# Patient Record
Sex: Female | Born: 1960 | Race: Black or African American | Hispanic: No | Marital: Single | Smoking: Never smoker
Health system: Southern US, Community
[De-identification: ages and names within clinical notes are randomized; demographics above are authoritative.]

## PROBLEM LIST (undated history)

## (undated) ENCOUNTER — Emergency Department (HOSPITAL_BASED_OUTPATIENT_CLINIC_OR_DEPARTMENT_OTHER): Admission: EM | Payer: BC Managed Care – PPO | Source: Home / Self Care

## (undated) DIAGNOSIS — I2699 Other pulmonary embolism without acute cor pulmonale: Secondary | ICD-10-CM

## (undated) DIAGNOSIS — F419 Anxiety disorder, unspecified: Secondary | ICD-10-CM

## (undated) DIAGNOSIS — J45909 Unspecified asthma, uncomplicated: Secondary | ICD-10-CM

## (undated) DIAGNOSIS — M940 Chondrocostal junction syndrome [Tietze]: Secondary | ICD-10-CM

## (undated) DIAGNOSIS — M1990 Unspecified osteoarthritis, unspecified site: Secondary | ICD-10-CM

## (undated) DIAGNOSIS — K219 Gastro-esophageal reflux disease without esophagitis: Secondary | ICD-10-CM

## (undated) DIAGNOSIS — I82409 Acute embolism and thrombosis of unspecified deep veins of unspecified lower extremity: Secondary | ICD-10-CM

## (undated) DIAGNOSIS — I10 Essential (primary) hypertension: Secondary | ICD-10-CM

## (undated) HISTORY — PX: LUMBAR DISC ARTHROPLASTY: SHX699

## (undated) HISTORY — DX: Anxiety disorder, unspecified: F41.9

## (undated) HISTORY — PX: KNEE ARTHROSCOPY W/ MENISCAL REPAIR: SHX1877

## (undated) HISTORY — PX: SPINE SURGERY: SHX786

## (undated) HISTORY — PX: CHOLECYSTECTOMY: SHX55

## (undated) HISTORY — DX: Gastro-esophageal reflux disease without esophagitis: K21.9

## (undated) HISTORY — DX: Chondrocostal junction syndrome (tietze): M94.0

---

## 2008-03-27 DIAGNOSIS — J410 Simple chronic bronchitis: Secondary | ICD-10-CM | POA: Insufficient documentation

## 2008-03-27 DIAGNOSIS — J42 Unspecified chronic bronchitis: Secondary | ICD-10-CM | POA: Insufficient documentation

## 2008-03-27 HISTORY — DX: Simple chronic bronchitis: J41.0

## 2008-09-03 DIAGNOSIS — K219 Gastro-esophageal reflux disease without esophagitis: Secondary | ICD-10-CM | POA: Insufficient documentation

## 2008-09-03 DIAGNOSIS — D259 Leiomyoma of uterus, unspecified: Secondary | ICD-10-CM | POA: Insufficient documentation

## 2008-09-03 DIAGNOSIS — J45909 Unspecified asthma, uncomplicated: Secondary | ICD-10-CM | POA: Insufficient documentation

## 2008-09-03 HISTORY — DX: Leiomyoma of uterus, unspecified: D25.9

## 2008-09-03 HISTORY — DX: Gastro-esophageal reflux disease without esophagitis: K21.9

## 2008-11-27 DIAGNOSIS — E559 Vitamin D deficiency, unspecified: Secondary | ICD-10-CM

## 2008-11-27 HISTORY — DX: Vitamin D deficiency, unspecified: E55.9

## 2010-05-17 DIAGNOSIS — I10 Essential (primary) hypertension: Secondary | ICD-10-CM | POA: Insufficient documentation

## 2010-05-17 HISTORY — DX: Essential (primary) hypertension: I10

## 2013-04-05 DIAGNOSIS — R9439 Abnormal result of other cardiovascular function study: Secondary | ICD-10-CM | POA: Insufficient documentation

## 2013-04-05 DIAGNOSIS — R079 Chest pain, unspecified: Secondary | ICD-10-CM | POA: Insufficient documentation

## 2013-04-05 HISTORY — DX: Abnormal result of other cardiovascular function study: R94.39

## 2014-06-26 HISTORY — DX: Morbid (severe) obesity due to excess calories: E66.01

## 2014-08-28 DIAGNOSIS — M170 Bilateral primary osteoarthritis of knee: Secondary | ICD-10-CM | POA: Insufficient documentation

## 2014-08-28 HISTORY — DX: Bilateral primary osteoarthritis of knee: M17.0

## 2015-10-19 DIAGNOSIS — R739 Hyperglycemia, unspecified: Secondary | ICD-10-CM | POA: Insufficient documentation

## 2015-10-19 HISTORY — DX: Hyperglycemia, unspecified: R73.9

## 2017-08-09 DIAGNOSIS — E782 Mixed hyperlipidemia: Secondary | ICD-10-CM | POA: Insufficient documentation

## 2017-08-09 DIAGNOSIS — D509 Iron deficiency anemia, unspecified: Secondary | ICD-10-CM | POA: Insufficient documentation

## 2017-08-09 DIAGNOSIS — D573 Sickle-cell trait: Secondary | ICD-10-CM | POA: Insufficient documentation

## 2017-08-09 HISTORY — DX: Iron deficiency anemia, unspecified: D50.9

## 2017-08-09 HISTORY — DX: Sickle-cell trait: D57.3

## 2017-08-09 HISTORY — DX: Morbid (severe) obesity due to excess calories: E66.01

## 2017-08-09 HISTORY — DX: Mixed hyperlipidemia: E78.2

## 2017-08-09 LAB — HM PAP SMEAR: HM Pap smear: NORMAL

## 2017-08-10 DIAGNOSIS — M1711 Unilateral primary osteoarthritis, right knee: Secondary | ICD-10-CM | POA: Insufficient documentation

## 2017-08-10 HISTORY — DX: Unilateral primary osteoarthritis, right knee: M17.11

## 2018-05-08 DIAGNOSIS — R053 Chronic cough: Secondary | ICD-10-CM | POA: Insufficient documentation

## 2018-05-08 DIAGNOSIS — F419 Anxiety disorder, unspecified: Secondary | ICD-10-CM | POA: Insufficient documentation

## 2018-06-05 DIAGNOSIS — H938X2 Other specified disorders of left ear: Secondary | ICD-10-CM | POA: Insufficient documentation

## 2019-05-07 DIAGNOSIS — F411 Generalized anxiety disorder: Secondary | ICD-10-CM | POA: Diagnosis not present

## 2019-05-07 DIAGNOSIS — F329 Major depressive disorder, single episode, unspecified: Secondary | ICD-10-CM | POA: Diagnosis not present

## 2019-06-14 DIAGNOSIS — M1711 Unilateral primary osteoarthritis, right knee: Secondary | ICD-10-CM | POA: Diagnosis not present

## 2019-06-14 DIAGNOSIS — M7662 Achilles tendinitis, left leg: Secondary | ICD-10-CM | POA: Diagnosis not present

## 2019-08-14 DIAGNOSIS — F411 Generalized anxiety disorder: Secondary | ICD-10-CM | POA: Diagnosis not present

## 2019-08-14 DIAGNOSIS — F329 Major depressive disorder, single episode, unspecified: Secondary | ICD-10-CM | POA: Diagnosis not present

## 2019-12-04 DIAGNOSIS — M25561 Pain in right knee: Secondary | ICD-10-CM | POA: Diagnosis not present

## 2019-12-04 DIAGNOSIS — M1711 Unilateral primary osteoarthritis, right knee: Secondary | ICD-10-CM | POA: Diagnosis not present

## 2019-12-05 DIAGNOSIS — M1711 Unilateral primary osteoarthritis, right knee: Secondary | ICD-10-CM | POA: Diagnosis not present

## 2019-12-05 DIAGNOSIS — R531 Weakness: Secondary | ICD-10-CM | POA: Diagnosis not present

## 2019-12-05 DIAGNOSIS — M25661 Stiffness of right knee, not elsewhere classified: Secondary | ICD-10-CM | POA: Diagnosis not present

## 2019-12-12 DIAGNOSIS — R531 Weakness: Secondary | ICD-10-CM | POA: Diagnosis not present

## 2019-12-12 DIAGNOSIS — M1711 Unilateral primary osteoarthritis, right knee: Secondary | ICD-10-CM | POA: Diagnosis not present

## 2019-12-12 DIAGNOSIS — M25661 Stiffness of right knee, not elsewhere classified: Secondary | ICD-10-CM | POA: Diagnosis not present

## 2019-12-20 DIAGNOSIS — R531 Weakness: Secondary | ICD-10-CM | POA: Diagnosis not present

## 2019-12-20 DIAGNOSIS — M1711 Unilateral primary osteoarthritis, right knee: Secondary | ICD-10-CM | POA: Diagnosis not present

## 2019-12-20 DIAGNOSIS — M25661 Stiffness of right knee, not elsewhere classified: Secondary | ICD-10-CM | POA: Diagnosis not present

## 2019-12-23 DIAGNOSIS — M1711 Unilateral primary osteoarthritis, right knee: Secondary | ICD-10-CM | POA: Diagnosis not present

## 2019-12-23 DIAGNOSIS — M25661 Stiffness of right knee, not elsewhere classified: Secondary | ICD-10-CM | POA: Diagnosis not present

## 2019-12-23 DIAGNOSIS — R531 Weakness: Secondary | ICD-10-CM | POA: Diagnosis not present

## 2019-12-30 DIAGNOSIS — M25661 Stiffness of right knee, not elsewhere classified: Secondary | ICD-10-CM | POA: Diagnosis not present

## 2019-12-30 DIAGNOSIS — M1711 Unilateral primary osteoarthritis, right knee: Secondary | ICD-10-CM | POA: Diagnosis not present

## 2019-12-30 DIAGNOSIS — R531 Weakness: Secondary | ICD-10-CM | POA: Diagnosis not present

## 2020-01-02 DIAGNOSIS — M1711 Unilateral primary osteoarthritis, right knee: Secondary | ICD-10-CM | POA: Diagnosis not present

## 2020-01-11 DIAGNOSIS — K859 Acute pancreatitis without necrosis or infection, unspecified: Secondary | ICD-10-CM | POA: Diagnosis not present

## 2020-01-11 DIAGNOSIS — F411 Generalized anxiety disorder: Secondary | ICD-10-CM | POA: Diagnosis not present

## 2020-01-12 DIAGNOSIS — Z91018 Allergy to other foods: Secondary | ICD-10-CM | POA: Diagnosis not present

## 2020-01-12 DIAGNOSIS — J45909 Unspecified asthma, uncomplicated: Secondary | ICD-10-CM | POA: Diagnosis not present

## 2020-01-12 DIAGNOSIS — K859 Acute pancreatitis without necrosis or infection, unspecified: Secondary | ICD-10-CM | POA: Diagnosis not present

## 2020-01-12 DIAGNOSIS — Z79899 Other long term (current) drug therapy: Secondary | ICD-10-CM | POA: Diagnosis not present

## 2020-01-12 DIAGNOSIS — Z91010 Allergy to peanuts: Secondary | ICD-10-CM | POA: Diagnosis not present

## 2020-01-12 DIAGNOSIS — Z981 Arthrodesis status: Secondary | ICD-10-CM | POA: Diagnosis not present

## 2020-01-12 DIAGNOSIS — F411 Generalized anxiety disorder: Secondary | ICD-10-CM | POA: Diagnosis not present

## 2020-01-12 DIAGNOSIS — D649 Anemia, unspecified: Secondary | ICD-10-CM | POA: Diagnosis not present

## 2020-01-12 DIAGNOSIS — D509 Iron deficiency anemia, unspecified: Secondary | ICD-10-CM | POA: Diagnosis not present

## 2020-01-12 DIAGNOSIS — K439 Ventral hernia without obstruction or gangrene: Secondary | ICD-10-CM | POA: Diagnosis not present

## 2020-01-12 DIAGNOSIS — Z91040 Latex allergy status: Secondary | ICD-10-CM | POA: Diagnosis not present

## 2020-01-12 DIAGNOSIS — R1084 Generalized abdominal pain: Secondary | ICD-10-CM | POA: Diagnosis not present

## 2020-01-12 DIAGNOSIS — R109 Unspecified abdominal pain: Secondary | ICD-10-CM | POA: Diagnosis not present

## 2020-01-12 DIAGNOSIS — Z9049 Acquired absence of other specified parts of digestive tract: Secondary | ICD-10-CM | POA: Diagnosis not present

## 2020-01-12 DIAGNOSIS — F410 Panic disorder [episodic paroxysmal anxiety] without agoraphobia: Secondary | ICD-10-CM | POA: Diagnosis not present

## 2020-01-12 DIAGNOSIS — R1013 Epigastric pain: Secondary | ICD-10-CM | POA: Diagnosis not present

## 2020-01-12 DIAGNOSIS — Z6841 Body Mass Index (BMI) 40.0 and over, adult: Secondary | ICD-10-CM | POA: Diagnosis not present

## 2020-01-12 DIAGNOSIS — K279 Peptic ulcer, site unspecified, unspecified as acute or chronic, without hemorrhage or perforation: Secondary | ICD-10-CM | POA: Diagnosis not present

## 2020-01-12 DIAGNOSIS — K3189 Other diseases of stomach and duodenum: Secondary | ICD-10-CM | POA: Diagnosis not present

## 2020-01-12 DIAGNOSIS — N393 Stress incontinence (female) (male): Secondary | ICD-10-CM | POA: Diagnosis not present

## 2020-02-20 DIAGNOSIS — K8500 Idiopathic acute pancreatitis without necrosis or infection: Secondary | ICD-10-CM | POA: Diagnosis not present

## 2020-02-20 DIAGNOSIS — K2980 Duodenitis without bleeding: Secondary | ICD-10-CM | POA: Diagnosis not present

## 2020-02-20 DIAGNOSIS — K2970 Gastritis, unspecified, without bleeding: Secondary | ICD-10-CM | POA: Diagnosis not present

## 2020-03-11 DIAGNOSIS — Z1211 Encounter for screening for malignant neoplasm of colon: Secondary | ICD-10-CM | POA: Diagnosis not present

## 2020-03-11 DIAGNOSIS — I10 Essential (primary) hypertension: Secondary | ICD-10-CM | POA: Diagnosis not present

## 2020-03-11 DIAGNOSIS — M1710 Unilateral primary osteoarthritis, unspecified knee: Secondary | ICD-10-CM | POA: Diagnosis not present

## 2020-03-12 DIAGNOSIS — F411 Generalized anxiety disorder: Secondary | ICD-10-CM | POA: Diagnosis not present

## 2020-03-12 DIAGNOSIS — F329 Major depressive disorder, single episode, unspecified: Secondary | ICD-10-CM | POA: Diagnosis not present

## 2020-04-09 DIAGNOSIS — F411 Generalized anxiety disorder: Secondary | ICD-10-CM | POA: Diagnosis not present

## 2020-04-09 DIAGNOSIS — F329 Major depressive disorder, single episode, unspecified: Secondary | ICD-10-CM | POA: Diagnosis not present

## 2020-04-09 LAB — COLOGUARD
COLOGUARD: NEGATIVE
Cologuard: NEGATIVE

## 2020-04-22 DIAGNOSIS — Z6841 Body Mass Index (BMI) 40.0 and over, adult: Secondary | ICD-10-CM | POA: Diagnosis not present

## 2020-04-22 DIAGNOSIS — M170 Bilateral primary osteoarthritis of knee: Secondary | ICD-10-CM | POA: Diagnosis not present

## 2020-05-20 DIAGNOSIS — F329 Major depressive disorder, single episode, unspecified: Secondary | ICD-10-CM | POA: Diagnosis not present

## 2020-05-20 DIAGNOSIS — F411 Generalized anxiety disorder: Secondary | ICD-10-CM | POA: Diagnosis not present

## 2020-05-27 DIAGNOSIS — D649 Anemia, unspecified: Secondary | ICD-10-CM | POA: Diagnosis not present

## 2020-05-27 DIAGNOSIS — J018 Other acute sinusitis: Secondary | ICD-10-CM | POA: Diagnosis not present

## 2020-05-27 DIAGNOSIS — Z6841 Body Mass Index (BMI) 40.0 and over, adult: Secondary | ICD-10-CM | POA: Diagnosis not present

## 2020-05-27 DIAGNOSIS — I10 Essential (primary) hypertension: Secondary | ICD-10-CM | POA: Diagnosis not present

## 2020-07-24 DIAGNOSIS — M170 Bilateral primary osteoarthritis of knee: Secondary | ICD-10-CM | POA: Diagnosis not present

## 2020-08-06 DIAGNOSIS — J4520 Mild intermittent asthma, uncomplicated: Secondary | ICD-10-CM

## 2020-08-06 DIAGNOSIS — J4521 Mild intermittent asthma with (acute) exacerbation: Secondary | ICD-10-CM | POA: Insufficient documentation

## 2020-08-06 HISTORY — DX: Mild intermittent asthma, uncomplicated: J45.20

## 2020-08-07 DIAGNOSIS — F329 Major depressive disorder, single episode, unspecified: Secondary | ICD-10-CM | POA: Diagnosis not present

## 2020-08-07 DIAGNOSIS — F411 Generalized anxiety disorder: Secondary | ICD-10-CM | POA: Diagnosis not present

## 2020-09-02 DIAGNOSIS — F329 Major depressive disorder, single episode, unspecified: Secondary | ICD-10-CM | POA: Diagnosis not present

## 2020-09-02 DIAGNOSIS — F411 Generalized anxiety disorder: Secondary | ICD-10-CM | POA: Diagnosis not present

## 2020-11-02 ENCOUNTER — Inpatient Hospital Stay (HOSPITAL_BASED_OUTPATIENT_CLINIC_OR_DEPARTMENT_OTHER)
Admission: EM | Admit: 2020-11-02 | Discharge: 2020-11-05 | DRG: 176 | Disposition: A | Discharge status: Home / Self Care | Payer: BC Managed Care – PPO | Attending: Family Medicine | Admitting: Family Medicine

## 2020-11-02 ENCOUNTER — Other Ambulatory Visit: Payer: Self-pay

## 2020-11-02 ENCOUNTER — Encounter (HOSPITAL_BASED_OUTPATIENT_CLINIC_OR_DEPARTMENT_OTHER): Payer: Self-pay | Admitting: Emergency Medicine

## 2020-11-02 DIAGNOSIS — R911 Solitary pulmonary nodule: Secondary | ICD-10-CM | POA: Diagnosis not present

## 2020-11-02 DIAGNOSIS — F331 Major depressive disorder, recurrent, moderate: Secondary | ICD-10-CM | POA: Diagnosis not present

## 2020-11-02 DIAGNOSIS — Z882 Allergy status to sulfonamides status: Secondary | ICD-10-CM | POA: Diagnosis not present

## 2020-11-02 DIAGNOSIS — Z6841 Body Mass Index (BMI) 40.0 and over, adult: Secondary | ICD-10-CM

## 2020-11-02 DIAGNOSIS — M1990 Unspecified osteoarthritis, unspecified site: Secondary | ICD-10-CM | POA: Diagnosis present

## 2020-11-02 DIAGNOSIS — I2699 Other pulmonary embolism without acute cor pulmonale: Secondary | ICD-10-CM | POA: Diagnosis not present

## 2020-11-02 DIAGNOSIS — I10 Essential (primary) hypertension: Secondary | ICD-10-CM | POA: Diagnosis not present

## 2020-11-02 DIAGNOSIS — R7989 Other specified abnormal findings of blood chemistry: Secondary | ICD-10-CM | POA: Diagnosis not present

## 2020-11-02 DIAGNOSIS — I2694 Multiple subsegmental pulmonary emboli without acute cor pulmonale: Secondary | ICD-10-CM | POA: Diagnosis not present

## 2020-11-02 DIAGNOSIS — R918 Other nonspecific abnormal finding of lung field: Secondary | ICD-10-CM | POA: Diagnosis present

## 2020-11-02 DIAGNOSIS — D649 Anemia, unspecified: Secondary | ICD-10-CM | POA: Diagnosis present

## 2020-11-02 DIAGNOSIS — Z20822 Contact with and (suspected) exposure to covid-19: Secondary | ICD-10-CM | POA: Diagnosis not present

## 2020-11-02 DIAGNOSIS — I82431 Acute embolism and thrombosis of right popliteal vein: Secondary | ICD-10-CM | POA: Diagnosis present

## 2020-11-02 DIAGNOSIS — F411 Generalized anxiety disorder: Secondary | ICD-10-CM | POA: Diagnosis not present

## 2020-11-02 DIAGNOSIS — Z79899 Other long term (current) drug therapy: Secondary | ICD-10-CM | POA: Diagnosis not present

## 2020-11-02 DIAGNOSIS — I2602 Saddle embolus of pulmonary artery with acute cor pulmonale: Secondary | ICD-10-CM | POA: Diagnosis not present

## 2020-11-02 DIAGNOSIS — R0602 Shortness of breath: Secondary | ICD-10-CM | POA: Diagnosis not present

## 2020-11-02 DIAGNOSIS — J45909 Unspecified asthma, uncomplicated: Secondary | ICD-10-CM | POA: Diagnosis present

## 2020-11-02 DIAGNOSIS — Z7901 Long term (current) use of anticoagulants: Secondary | ICD-10-CM | POA: Diagnosis present

## 2020-11-02 HISTORY — DX: Unspecified osteoarthritis, unspecified site: M19.90

## 2020-11-02 HISTORY — DX: Unspecified asthma, uncomplicated: J45.909

## 2020-11-02 HISTORY — DX: Essential (primary) hypertension: I10

## 2020-11-02 NOTE — ED Triage Notes (Signed)
Pt c/o SOB when ambulating x 1 day

## 2020-11-03 ENCOUNTER — Emergency Department (HOSPITAL_BASED_OUTPATIENT_CLINIC_OR_DEPARTMENT_OTHER): Payer: BC Managed Care – PPO

## 2020-11-03 DIAGNOSIS — D649 Anemia, unspecified: Secondary | ICD-10-CM | POA: Diagnosis present

## 2020-11-03 DIAGNOSIS — R918 Other nonspecific abnormal finding of lung field: Secondary | ICD-10-CM | POA: Diagnosis present

## 2020-11-03 DIAGNOSIS — I82431 Acute embolism and thrombosis of right popliteal vein: Secondary | ICD-10-CM | POA: Diagnosis present

## 2020-11-03 DIAGNOSIS — I2602 Saddle embolus of pulmonary artery with acute cor pulmonale: Secondary | ICD-10-CM | POA: Diagnosis not present

## 2020-11-03 DIAGNOSIS — I10 Essential (primary) hypertension: Secondary | ICD-10-CM | POA: Diagnosis present

## 2020-11-03 DIAGNOSIS — I2699 Other pulmonary embolism without acute cor pulmonale: Secondary | ICD-10-CM

## 2020-11-03 DIAGNOSIS — Z79899 Other long term (current) drug therapy: Secondary | ICD-10-CM | POA: Diagnosis not present

## 2020-11-03 DIAGNOSIS — R0602 Shortness of breath: Secondary | ICD-10-CM | POA: Diagnosis present

## 2020-11-03 DIAGNOSIS — J45909 Unspecified asthma, uncomplicated: Secondary | ICD-10-CM | POA: Diagnosis present

## 2020-11-03 DIAGNOSIS — Z7901 Long term (current) use of anticoagulants: Secondary | ICD-10-CM | POA: Diagnosis present

## 2020-11-03 DIAGNOSIS — Z882 Allergy status to sulfonamides status: Secondary | ICD-10-CM | POA: Diagnosis not present

## 2020-11-03 DIAGNOSIS — Z20822 Contact with and (suspected) exposure to covid-19: Secondary | ICD-10-CM | POA: Diagnosis present

## 2020-11-03 DIAGNOSIS — M1990 Unspecified osteoarthritis, unspecified site: Secondary | ICD-10-CM | POA: Diagnosis present

## 2020-11-03 DIAGNOSIS — R911 Solitary pulmonary nodule: Secondary | ICD-10-CM | POA: Diagnosis present

## 2020-11-03 DIAGNOSIS — R7989 Other specified abnormal findings of blood chemistry: Secondary | ICD-10-CM | POA: Diagnosis not present

## 2020-11-03 DIAGNOSIS — Z6841 Body Mass Index (BMI) 40.0 and over, adult: Secondary | ICD-10-CM | POA: Diagnosis not present

## 2020-11-03 HISTORY — DX: Other pulmonary embolism without acute cor pulmonale: I26.99

## 2020-11-03 LAB — COMPREHENSIVE METABOLIC PANEL
ALT: 12 U/L (ref 0–44)
AST: 16 U/L (ref 15–41)
Albumin: 3.6 g/dL (ref 3.5–5.0)
Alkaline Phosphatase: 73 U/L (ref 38–126)
Anion gap: 8 (ref 5–15)
BUN: 13 mg/dL (ref 8–23)
CO2: 25 mmol/L (ref 22–32)
Calcium: 9.3 mg/dL (ref 8.9–10.3)
Chloride: 104 mmol/L (ref 98–111)
Creatinine, Ser: 1 mg/dL (ref 0.44–1.00)
GFR, Estimated: >60 mL/min (ref >60)
Glucose, Bld: 105 mg/dL — ABNORMAL HIGH (ref 70–99)
Potassium: 3.1 mmol/L — ABNORMAL LOW (ref 3.5–5.1)
Sodium: 137 mmol/L (ref 135–145)
Total Bilirubin: 0.2 mg/dL — ABNORMAL LOW (ref 0.3–1.2)
Total Protein: 7.1 g/dL (ref 6.5–8.1)

## 2020-11-03 LAB — CBC WITH DIFFERENTIAL/PLATELET
Abs Immature Granulocytes: 0.02 10*3/uL (ref 0.00–0.07)
Basophils Absolute: 0 10*3/uL (ref 0.0–0.1)
Basophils Relative: 0 %
Eosinophils Absolute: 0.1 10*3/uL (ref 0.0–0.5)
Eosinophils Relative: 1 %
HCT: 36.1 % (ref 36.0–46.0)
Hemoglobin: 11.7 g/dL — ABNORMAL LOW (ref 12.0–15.0)
Immature Granulocytes: 0 %
Lymphocytes Relative: 21 %
Lymphs Abs: 1.6 10*3/uL (ref 0.7–4.0)
MCH: 25.9 pg — ABNORMAL LOW (ref 26.0–34.0)
MCHC: 32.4 g/dL (ref 30.0–36.0)
MCV: 79.9 fL — ABNORMAL LOW (ref 80.0–100.0)
Monocytes Absolute: 0.5 10*3/uL (ref 0.1–1.0)
Monocytes Relative: 6 %
Neutro Abs: 5.5 10*3/uL (ref 1.7–7.7)
Neutrophils Relative %: 72 %
Platelets: 208 10*3/uL (ref 150–400)
RBC: 4.52 MIL/uL (ref 3.87–5.11)
RDW: 14.8 % (ref 11.5–15.5)
WBC: 7.8 10*3/uL (ref 4.0–10.5)
nRBC: 0 % (ref 0.0–0.2)

## 2020-11-03 LAB — BRAIN NATRIURETIC PEPTIDE: B Natriuretic Peptide: 19.8 pg/mL (ref 0.0–100.0)

## 2020-11-03 LAB — HEPARIN LEVEL (UNFRACTIONATED): Heparin Unfractionated: 1.01 IU/mL — ABNORMAL HIGH (ref 0.30–0.70)

## 2020-11-03 LAB — RESP PANEL BY RT-PCR (FLU A&B, COVID) ARPGX2
Influenza A by PCR: NEGATIVE
Influenza B by PCR: NEGATIVE
SARS Coronavirus 2 by RT PCR: NEGATIVE

## 2020-11-03 LAB — TROPONIN I (HIGH SENSITIVITY)
Troponin I (High Sensitivity): 4 ng/L (ref <18)
Troponin I (High Sensitivity): 4 ng/L (ref <18)

## 2020-11-03 LAB — D-DIMER, QUANTITATIVE: D-Dimer, Quant: 3.23 ug/mL-FEU — ABNORMAL HIGH (ref 0.00–0.50)

## 2020-11-03 MED ORDER — METOPROLOL SUCCINATE ER 25 MG PO TB24
25.0000 mg | ORAL_TABLET | Freq: Every day | ORAL | Status: DC
  Administered 2020-11-03 – 2020-11-05 (×3): 25 mg via ORAL
  Filled 2020-11-03 (×3): qty 1

## 2020-11-03 MED ORDER — HEPARIN BOLUS VIA INFUSION
5000.0000 [IU] | Freq: Once | INTRAVENOUS | Status: AC
  Administered 2020-11-03: 5000 [IU] via INTRAVENOUS

## 2020-11-03 MED ORDER — UMECLIDINIUM BROMIDE 62.5 MCG/INH IN AEPB
1.0000 | INHALATION_SPRAY | Freq: Every day | RESPIRATORY_TRACT | Status: DC
  Administered 2020-11-04 – 2020-11-05 (×2): 1 via RESPIRATORY_TRACT
  Filled 2020-11-03: qty 7

## 2020-11-03 MED ORDER — HEPARIN (PORCINE) 25000 UT/250ML-% IV SOLN
1100.0000 [IU]/h | INTRAVENOUS | Status: AC
  Administered 2020-11-03: 1300 [IU]/h via INTRAVENOUS
  Administered 2020-11-04: 1100 [IU]/h via INTRAVENOUS
  Filled 2020-11-03 (×2): qty 250

## 2020-11-03 MED ORDER — ALBUTEROL SULFATE HFA 108 (90 BASE) MCG/ACT IN AERS: 2.0000 | INHALATION_SPRAY | Freq: Four times a day (QID) | RESPIRATORY_TRACT | Status: DC | PRN

## 2020-11-03 MED ORDER — LORAZEPAM 2 MG/ML IJ SOLN
1.0000 mg | Freq: Once | INTRAMUSCULAR | Status: AC
  Administered 2020-11-03: 1 mg via INTRAVENOUS
  Filled 2020-11-03: qty 1

## 2020-11-03 MED ORDER — POTASSIUM CHLORIDE CRYS ER 20 MEQ PO TBCR
40.0000 meq | EXTENDED_RELEASE_TABLET | Freq: Every day | ORAL | Status: DC
  Administered 2020-11-03 – 2020-11-04 (×2): 40 meq via ORAL
  Filled 2020-11-03 (×2): qty 2

## 2020-11-03 MED ORDER — INFLUENZA VAC SPLIT QUAD 0.5 ML IM SUSY
0.5000 mL | PREFILLED_SYRINGE | INTRAMUSCULAR | Status: DC
  Filled 2020-11-03: qty 0.5

## 2020-11-03 MED ORDER — HYDROCODONE-ACETAMINOPHEN 5-325 MG PO TABS
1.0000 | ORAL_TABLET | ORAL | Status: DC | PRN
  Filled 2020-11-03: qty 2

## 2020-11-03 MED ORDER — CALCIUM CARBONATE ANTACID 500 MG PO CHEW
1.0000 | CHEWABLE_TABLET | Freq: Two times a day (BID) | ORAL | Status: DC
  Administered 2020-11-03 – 2020-11-05 (×4): 200 mg via ORAL
  Filled 2020-11-03 (×4): qty 1

## 2020-11-03 MED ORDER — HEPARIN (PORCINE) 25000 UT/250ML-% IV SOLN
1500.0000 [IU]/h | INTRAVENOUS | Status: DC
  Administered 2020-11-03: 1500 [IU]/h via INTRAVENOUS
  Filled 2020-11-03: qty 250

## 2020-11-03 MED ORDER — ALBUTEROL SULFATE (2.5 MG/3ML) 0.083% IN NEBU: 2.5000 mg | INHALATION_SOLUTION | Freq: Four times a day (QID) | RESPIRATORY_TRACT | Status: DC | PRN

## 2020-11-03 MED ORDER — IOHEXOL 350 MG/ML SOLN
100.0000 mL | Freq: Once | INTRAVENOUS | Status: AC | PRN
  Administered 2020-11-03: 100 mL via INTRAVENOUS

## 2020-11-03 MED ORDER — POTASSIUM CHLORIDE 20 MEQ PO PACK
40.0000 meq | PACK | Freq: Once | ORAL | Status: AC
  Administered 2020-11-03: 40 meq via ORAL
  Filled 2020-11-03: qty 2

## 2020-11-03 MED ORDER — LISINOPRIL 10 MG PO TABS
10.0000 mg | ORAL_TABLET | Freq: Every day | ORAL | Status: DC
  Administered 2020-11-03: 10 mg via ORAL
  Filled 2020-11-03: qty 1

## 2020-11-03 MED ORDER — HYDROXYZINE HCL 25 MG PO TABS
25.0000 mg | ORAL_TABLET | Freq: Three times a day (TID) | ORAL | Status: DC | PRN
  Administered 2020-11-03: 25 mg via ORAL
  Filled 2020-11-03: qty 1

## 2020-11-03 NOTE — ED Provider Notes (Signed)
[facility] EMERGENCY DEPARTMENT Provider Note   CSN: [id number] Arrival date & time: 11/02/20  2019     History Chief Complaint  Patient presents with   Shortness of Breath    Alyssa [name] is a 60 y.o. female.  Patient is a 60 year old female with past medical history of asthma and hypertension.  Patient presenting today for evaluation of shortness of breath.  She describes a 2-day history of dyspnea on exertion.  She states that she has been walking short distances, then feels dyspneic.  She denies fevers, chills, chest pain, or cough.  She denies noting swelling in her legs.  The history is provided by the patient.  Shortness of Breath Severity:  Moderate Onset quality:  Sudden Duration:  2 days Timing:  Intermittent Progression:  Unchanged Chronicity:  New Relieved by:  Rest Worsened by:  Exertion     Past Medical History:  Diagnosis Date   Arthritis    Asthma    Hypertension     There are no problems to display for this patient.   Past Surgical History:  Procedure Laterality Date   CESAREAN SECTION     CHOLECYSTECTOMY     KNEE ARTHROSCOPY W/ MENISCAL REPAIR Left    LUMBAR DISC ARTHROPLASTY       OB History   No obstetric history on file.     History reviewed. No pertinent family history.  Social History   Tobacco Use   Smoking status: Never   Smokeless tobacco: Never  Substance Use Topics   Alcohol use: Never   Drug use: Never    Home Medications Prior to Admission medications   Medication Sig Start Date End Date Taking? Authorizing Provider  albuterol (VENTOLIN HFA) 108 (90 Base) MCG/ACT inhaler Inhale 2 puffs into the lungs every 6 (six) hours as needed. 10/16/20   [provider]  Azelastine HCl 137 MCG/SPRAY SOLN Place 1 spray into both nostrils 2 (two) times daily. 10/27/20   [provider]  celecoxib (CELEBREX) 100 MG capsule Take 100 mg by mouth 2 (two) times daily. 09/17/20   [provider]   hydrochlorothiazide (HYDRODIURIL) 12.5 MG tablet Take 12.5 mg by mouth daily. 10/20/20   [provider]  lisinopril (ZESTRIL) 10 MG tablet Take 10 mg by mouth daily. 10/19/20   [provider]  metoprolol succinate (TOPROL-XL) 25 MG 24 hr tablet Take 25 mg by mouth daily. 10/16/20   [provider]    Allergies    Sulfa antibiotics  Review of Systems   Review of Systems  Respiratory:  Positive for shortness of breath.   All other systems reviewed and are negative.  Physical Exam Updated Vital Signs BP 129/85 (BP Location: Right Arm)   Pulse 77   Temp 98.6 F (37 C) (Oral)   Resp 18   Ht 5\' 6"  (1.676 m)   Wt 121.1 kg   SpO2 97%   BMI 43.09 kg/m   Physical Exam Vitals and nursing note reviewed.  Constitutional:      General: She is not in acute distress.    Appearance: She is well-developed. She is not diaphoretic.  HENT:     Head: Normocephalic and atraumatic.  Cardiovascular:     Rate and Rhythm: Normal rate and regular rhythm.     Heart sounds: No murmur heard.   No friction rub. No gallop.  Pulmonary:     Effort: Pulmonary effort is normal. No respiratory distress.     Breath sounds: Normal breath  sounds. No wheezing.  Abdominal:     General: Bowel sounds are normal. There is no distension.     Palpations: Abdomen is soft.     Tenderness: There is no abdominal tenderness.  Musculoskeletal:        General: Normal range of motion.     Cervical back: Normal range of motion and neck supple.     Right lower leg: No tenderness. No edema.     Left lower leg: No tenderness. No edema.     Comments: There is no calf tenderness.  Bevelyn Buckles' sign is absent bilaterally.  Skin:    General: Skin is warm and dry.  Neurological:     General: No focal deficit present.     Mental Status: She is alert and oriented to person, place, and time.    ED Results / Procedures / Treatments   Labs (all labs ordered are listed, but only abnormal results are  displayed) Labs Reviewed - No data to display  EKG EKG Interpretation  Date/Time:  Monday November 02 2020 20:38:33 EDT Ventricular Rate:  89 PR Interval:  144 QRS Duration: 72 QT Interval:  354 QTC Calculation: 430 R Axis:   -2 Text Interpretation: Normal sinus rhythm Possible Inferior infarct , age undetermined Possible Anterolateral infarct , age undetermined Abnormal ECG Confirmed by Veryl Speak [phone]) on 11/03/2020 1:20:49 AM  Radiology No results found.  Procedures Procedures   Medications Ordered in ED Medications - No data to display  ED Course  I have reviewed the triage vital signs and the nursing notes.  Pertinent labs & imaging results that were available during my care of the patient were reviewed by me and considered in my medical decision making (see chart for details).    MDM Rules/Calculators/A&P  Patient presenting here with complaints of dyspnea on exertion over the past 2 days.  She is denying any chest pain or leg swelling.  Work-up initiated including laboratory studies, BNP, troponin, EKG, and D-dimer.  Everything has returned unremarkable with the exception of the D-dimer which is elevated at 3.3.  CTA of the chest then obtained showing bilateral pulmonary emboli.  Patient is not having any hypoxia and vitals are stable, but due to the bilateral nature of the clots feel as though admission is indicated.  I have spoken with Dr. Hal Hope who agrees to admit.  CRITICAL CARE Performed by: Veryl Speak Total critical care time: 40 minutes Critical care time was exclusive of separately billable procedures and treating other patients. Critical care was necessary to treat or prevent imminent or life-threatening deterioration. Critical care was time spent personally by me on the following activities: development of treatment plan with patient and/or surrogate as well as nursing, discussions with consultants, evaluation of patient's response to treatment,  examination of patient, obtaining history from patient or surrogate, ordering and performing treatments and interventions, ordering and review of laboratory studies, ordering and review of radiographic studies, pulse oximetry and re-evaluation of patient's condition.   Final Clinical Impression(s) / ED Diagnoses Final diagnoses:  None    Rx / DC Orders ED Discharge Orders     None        Veryl Speak, MD 11/03/20 [phone]

## 2020-11-03 NOTE — Progress Notes (Signed)
ANTICOAGULATION CONSULT NOTE  Pharmacy Consult for Heparin Indication: pulmonary embolus  Allergies  Allergen Reactions   Sulfa Antibiotics Hives    Patient Measurements: Height: 5\' 6"  (167.6 cm) Weight: 121.1 kg (267 lb) IBW/kg (Calculated) : 59.3 Heparin Dosing Weight: 88 kg  Vital Signs: BP: 118/79 (10/04 1510) Pulse Rate: 100 (10/04 1510)  Labs: Recent Labs    11/03/20 0145 11/03/20 0400 11/03/20 1201  HGB 11.7*  --   --   HCT 36.1  --   --   PLT 208  --   --   HEPARINUNFRC  --   --  1.01*  CREATININE 1.00  --   --   TROPONINIHS 4 4  --    Estimated Creatinine Clearance: 78.3 mL/min (by C-G formula based on SCr of 1 mg/dL).  Assessment: 61 y.o. F presents with SOB. Found to have multiple b/l PE without R heart strain on CT. To begin heparin per pharmacy. CBC ok on admission. No AC PTA.  Initial heparin level is above goal at 1.01. No bleeding noted and drawn appropriately.   Goal of Therapy:  Heparin level 0.3-0.7 units/ml Monitor platelets by anticoagulation protocol: Yes   Plan:  Hold heparin - confirmed discontinued just prior to transfer Restart heparin infusion upon arrival to [facility] or by 1700 at reduced rate of 1300 units/hr Check an 8 hr heparin level  Daily heparin level and CBC  Salome Arnt, PharmD, BCPS Clinical Pharmacist Please see AMION for all pharmacy numbers 11/03/2020 4:07 PM

## 2020-11-03 NOTE — Progress Notes (Signed)
ANTICOAGULATION CONSULT NOTE - Initial Consult  Pharmacy Consult for Heparin Indication: pulmonary embolus  Allergies  Allergen Reactions   Sulfa Antibiotics Hives    Patient Measurements: Height: 5\' 6"  (167.6 cm) Weight: 121.1 kg (267 lb) IBW/kg (Calculated) : 59.3 Heparin Dosing Weight: 88 kg  Vital Signs: Temp: 98.6 F (37 C) (10/03 2027) Temp Source: Oral (10/03 2027) BP: 129/85 (10/04 0021) Pulse Rate: 77 (10/04 0021)  Labs: Recent Labs    11/03/20 0145  HGB 11.7*  HCT 36.1  PLT 208  CREATININE 1.00  TROPONINIHS 4    Estimated Creatinine Clearance: 78.3 mL/min (by C-G formula based on SCr of 1 mg/dL).   Medical History: Past Medical History:  Diagnosis Date   Arthritis    Asthma    Hypertension     Medications:  Awaiting electronic med rec  Assessment: 60 y.o. F presents with SOB. Found to have multiple b/l PE without R heart strain on CT. To begin heparin per pharmacy. CBC ok on admission. No AC PTA.  Goal of Therapy:  Heparin level 0.3-0.7 units/ml Monitor platelets by anticoagulation protocol: Yes   Plan:  Heparin IV bolus 5000 units Heparin gtt at 1500 units/hr Will f/u heparin level in 6 hours Daily heparin level and CBC  Sherlon Handing, PharmD, BCPS Please see amion for complete clinical pharmacist phone list 11/03/2020,3:42 AM

## 2020-11-03 NOTE — ED Notes (Signed)
[facility] at bedside to transport pt to [facility]. Pt stable for transfer

## 2020-11-03 NOTE — ED Notes (Signed)
Pt on phone resting, reports some relief of anxiety from ativan.

## 2020-11-03 NOTE — H&P (Signed)
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], morbid obesity and BMI 43, Achilles tendinitis, knee surgeries, back pain, HTN on meds Subacute onset shortness of breath X several weeks almost since August which crescendoed over the past week to shortness of breath with regular daily activities walking to the car walking around the house Unusual for her no long car travel or trips, no hormone replacement non-smoker Mother has history of lung clots unclear rest of history  Came to outside ER found to have dimer 3.2, CT = multiple bilateral pulmonary emboli, COVID-negative Potassium found to be 3.1 Mildly anemic at 11 WBC 7 BMP 19 troponin 4  Review of Systems:  No chest pain fever chills long travel blurred vision double vision unilateral weakness dark stools tarry stool dysuria seizure chills rigors sneeze cough cold  ED Course: Labs performed Started on heparin   Past Medical History:  Diagnosis Date   Arthritis    Asthma    Hypertension    Past Surgical History:  Procedure Laterality Date   CESAREAN SECTION     CHOLECYSTECTOMY     KNEE ARTHROSCOPY W/ MENISCAL REPAIR Left    LUMBAR DISC ARTHROPLASTY      reports that she has never smoked. She has never used smokeless tobacco. She reports that she does not drink alcohol and does not use drugs.  Mobility: Independent  Allergies  Allergen Reactions   Sulfa Antibiotics Hives   History reviewed. No pertinent family history. Prior to Admission medications   Medication Sig Start Date End Date Taking? Authorizing Provider  albuterol (VENTOLIN HFA) 108 (90 Base) MCG/ACT inhaler Inhale 2 puffs into the lungs every 6 (six) hours as needed. 10/16/20   [provider]  Azelastine HCl 137 MCG/SPRAY SOLN Place 1  spray into both nostrils 2 (two) times daily. 10/27/20   [provider]  celecoxib (CELEBREX) 100 MG capsule Take 100 mg by mouth 2 (two) times daily. 09/17/20   [provider]  hydrochlorothiazide (HYDRODIURIL) 12.5 MG tablet Take 12.5 mg by mouth daily. 10/20/20   [provider]  lisinopril (ZESTRIL) 10 MG tablet Take 10 mg by mouth daily. 10/19/20   [provider]  metoprolol succinate (TOPROL-XL) 25 MG 24 hr tablet Take 25 mg by mouth daily. 10/16/20   [provider]    Physical Exam:  Vitals:   11/03/20 1510 11/03/20 1719  BP: 118/79 (!) 150/88  Pulse: 100 96  Resp: 20 (!) 22  Temp:  98.4 F (36.9 C)  SpO2: 96% 97%    Awake coherent pleasant slightly nervous no distress EOMI NCAT no focal deficit external ocular movements intact smile symmetric shoulder shrug intact power 5/5 bilateral lower extremities upper extremities CTA B no rales no rhonchi no added sound abdomen obese nontender nondistended no rebound no guarding  I have personally reviewed following labs and imaging studies  Labs:  As above  Imaging studies:  As above  Medical tests:  EKG independently reviewed: Sinus rhythm T wave inversions V2 V3 but nothing acute-low voltage complexes  Test discussed with performing physician: n   Decision to obtain old records:  y   Review and summation of old records:  y   Active Problems:   Pulmonary embolism (HCC)   Assessment/Plan Acute pulmonary embolism unprovoked Probably subacute-start  heparin-counseled with pharmacy re DOAC in a.m. Get echocardiogram Doppler ultrasound of lower extremities in addition May require outpatient work-up for etiology of the same given no clear inciting factor Situational anxiety Start Atarax 25 3 times daily as needed Hypertension Resume home metoprolol XL 25, lisinopril 10, HCTZ held for now Multiple joint issues including microdiscectomy back Hold celecoxib at this time if  patient prefers would use Naprosyn, can use low-dose hydrocodone or Tylenol for pain Seasonal asthma Continue albuterol as as needed, Trelegy ordered but can be discontinued if no needs 12 mm left lung nodule Needs repeat CT 3 months and follow-up with outpatient Impaired glucose tolerance Monitor   Severity of Illness: The appropriate patient status for this patient is INPATIENT. Inpatient status is judged to be reasonable and necessary in order to provide the required intensity of service to ensure the patient's safety. The patient's presenting symptoms, physical exam findings, and initial radiographic and laboratory data in the context of their chronic comorbidities is felt to place them at high risk for further clinical deterioration. Furthermore, it is not anticipated that the patient will be medically stable for discharge from the hospital within 2 midnights of admission. The following factors support the patient status of inpatient.   " The patient's presenting symptoms include shortness of breath. " The worrisome physical exam findings include . " The initial radiographic and laboratory data are worrisome because of obesity. " The chronic co-morbidities include obesity.   * I certify that at the point of admission it is my clinical judgment that the patient will require inpatient hospital care spanning beyond 2 midnights from the point of admission due to high intensity of service, high risk for further deterioration and high frequency of surveillance required.*   DVT prophylaxis: On heparin Code Status: Full Family Communication: None Consults called: No  Time spent: 76 minutes  Verlon Au, MD Jerl Mina my NP partners at night for Care related issues] [facility] --[facility]ia [employer] [facility] , [email or web address]; password [facility]  11/03/2020, 5:34 PM

## 2020-11-03 NOTE — ED Notes (Signed)
Report called to Lauretta Grill, RN for [phone].  Pt given update.  Resting comfortably.

## 2020-11-03 NOTE — ED Notes (Signed)
[facility] called for report, ETA 15 min.  Pt made aware.

## 2020-11-04 ENCOUNTER — Inpatient Hospital Stay (HOSPITAL_COMMUNITY): Payer: BC Managed Care – PPO

## 2020-11-04 ENCOUNTER — Other Ambulatory Visit (HOSPITAL_COMMUNITY): Payer: Self-pay

## 2020-11-04 ENCOUNTER — Other Ambulatory Visit (HOSPITAL_COMMUNITY): Payer: BC Managed Care – PPO

## 2020-11-04 DIAGNOSIS — R0602 Shortness of breath: Secondary | ICD-10-CM

## 2020-11-04 DIAGNOSIS — I2602 Saddle embolus of pulmonary artery with acute cor pulmonale: Secondary | ICD-10-CM

## 2020-11-04 DIAGNOSIS — R911 Solitary pulmonary nodule: Secondary | ICD-10-CM

## 2020-11-04 DIAGNOSIS — R7989 Other specified abnormal findings of blood chemistry: Secondary | ICD-10-CM

## 2020-11-04 DIAGNOSIS — I2699 Other pulmonary embolism without acute cor pulmonale: Secondary | ICD-10-CM | POA: Diagnosis not present

## 2020-11-04 HISTORY — DX: Solitary pulmonary nodule: R91.1

## 2020-11-04 LAB — COMPREHENSIVE METABOLIC PANEL
ALT: 11 U/L (ref 0–44)
AST: 15 U/L (ref 15–41)
Albumin: 2.9 g/dL — ABNORMAL LOW (ref 3.5–5.0)
Alkaline Phosphatase: 61 U/L (ref 38–126)
Anion gap: 6 (ref 5–15)
BUN: 12 mg/dL (ref 8–23)
CO2: 24 mmol/L (ref 22–32)
Calcium: 8.8 mg/dL — ABNORMAL LOW (ref 8.9–10.3)
Chloride: 110 mmol/L (ref 98–111)
Creatinine, Ser: 0.98 mg/dL (ref 0.44–1.00)
GFR, Estimated: >60 mL/min (ref >60)
Glucose, Bld: 95 mg/dL (ref 70–99)
Potassium: 3.9 mmol/L (ref 3.5–5.1)
Sodium: 140 mmol/L (ref 135–145)
Total Bilirubin: 0.5 mg/dL (ref 0.3–1.2)
Total Protein: 6.1 g/dL — ABNORMAL LOW (ref 6.5–8.1)

## 2020-11-04 LAB — CBC
HCT: 33.7 % — ABNORMAL LOW (ref 36.0–46.0)
Hemoglobin: 10.9 g/dL — ABNORMAL LOW (ref 12.0–15.0)
MCH: 25.6 pg — ABNORMAL LOW (ref 26.0–34.0)
MCHC: 32.3 g/dL (ref 30.0–36.0)
MCV: 79.3 fL — ABNORMAL LOW (ref 80.0–100.0)
Platelets: 182 10*3/uL (ref 150–400)
RBC: 4.25 MIL/uL (ref 3.87–5.11)
RDW: 14.8 % (ref 11.5–15.5)
WBC: 6.4 10*3/uL (ref 4.0–10.5)
nRBC: 0 % (ref 0.0–0.2)

## 2020-11-04 LAB — ECHOCARDIOGRAM COMPLETE
Area-P 1/2: 5.27 cm2
Calc EF: 74.1 %
Height: 66 in
S' Lateral: 2.4 cm
Single Plane A2C EF: 77.2 %
Single Plane A4C EF: 71.8 %
Weight: 4321.02 oz

## 2020-11-04 LAB — PROTIME-INR
INR: 1.2 (ref 0.8–1.2)
Prothrombin Time: 15.2 seconds (ref 11.4–15.2)

## 2020-11-04 LAB — HEPARIN LEVEL (UNFRACTIONATED)
Heparin Unfractionated: 0.65 IU/mL (ref 0.30–0.70)
Heparin Unfractionated: 0.83 IU/mL — ABNORMAL HIGH (ref 0.30–0.70)
Heparin Unfractionated: 0.43 IU/mL (ref 0.30–0.70)

## 2020-11-04 MED ORDER — ACETAMINOPHEN 325 MG PO TABS
650.0000 mg | ORAL_TABLET | Freq: Four times a day (QID) | ORAL | Status: DC | PRN
  Administered 2020-11-04 (×2): 650 mg via ORAL
  Filled 2020-11-04 (×2): qty 2

## 2020-11-04 NOTE — TOC Benefit Eligibility Note (Signed)
Patient Teacher, English as a foreign language completed.    The patient is currently admitted and upon discharge could be taking Eliquis Starter Pack.  The current 30 day co-pay is, $0.00.   The patient is currently admitted and upon discharge could be taking Xarelto Starter Pack.  The current 30 day co-pay is, $0.00.   The patient is insured through [place], [place] Patient Advocate Specialist [place] Team Direct Number: [phone]  Fax: [phone]

## 2020-11-04 NOTE — Progress Notes (Signed)
ANTICOAGULATION CONSULT NOTE  Pharmacy Consult for Heparin Indication: pulmonary embolus  Allergies  Allergen Reactions   Sulfa Antibiotics Hives    Patient Measurements: Height: 5\' 6"  (167.6 cm) Weight: 122.5 kg (270 lb 1 oz) IBW/kg (Calculated) : 59.3  Heparin Dosing Weight: 88 kg  Vital Signs: Temp: 98 F (36.7 C) (10/05 1228) BP: 108/69 (10/05 1228) Pulse Rate: 96 (10/05 1228)  Labs: Recent Labs    11/03/20 0145 11/03/20 0400 11/03/20 1201 11/04/20 0156 11/04/20 0851 11/04/20 1743  HGB 11.7*  --   --  10.9*  --   --   HCT 36.1  --   --  33.7*  --   --   PLT 208  --   --  182  --   --   LABPROT  --   --   --  15.2  --   --   INR  --   --   --  1.2  --   --   HEPARINUNFRC  --   --    < > 0.65 0.83* 0.43  CREATININE 1.00  --   --  0.98  --   --   TROPONINIHS 4 4  --   --   --   --    < > = values in this interval not displayed.     Estimated Creatinine Clearance: 80.5 mL/min (by C-G formula based on SCr of 0.98 mg/dL).   Medications:  Scheduled:   calcium carbonate  1 tablet Oral BID   influenza vac split quadrivalent PF  0.5 mL Intramuscular Tomorrow-1000   metoprolol succinate  25 mg Oral Daily   umeclidinium bromide  1 puff Inhalation Daily   Infusions:   heparin 1,100 Units/hr (11/04/20 1216)    Assessment: 60 y.o. F presents with SOB. Found to have multiple bilateral PEs without R heart strain on CT. No anticoagulation PTA. Pharmacy consulted to manage heparin infusion.   -Heparin level now at goal on 1100 units/hr   Goal of Therapy:  Heparin level 0.3-0.7 units/ml Monitor platelets by anticoagulation protocol: Yes   Plan:  Continue heparin 1100 units/hr Daily heparin level and CBC  Hildred Laser, PharmD Clinical Pharmacist **Pharmacist phone directory can now be found on [email or web address] ([email or web address]).  Listed under [place].

## 2020-11-04 NOTE — Plan of Care (Signed)

## 2020-11-04 NOTE — Progress Notes (Signed)
Lower extremity venous bilateral study completed.   Please see CV Proc for preliminary results.   Maelee Hoot, RDMS, RVT  

## 2020-11-04 NOTE — Progress Notes (Signed)
ANTICOAGULATION CONSULT NOTE  Pharmacy Consult for Heparin Indication: pulmonary embolus  Allergies  Allergen Reactions   Sulfa Antibiotics Hives    Patient Measurements: Height: 5\' 6"  (167.6 cm) Weight: 121.1 kg (267 lb) IBW/kg (Calculated) : 59.3 Heparin Dosing Weight: 88 kg  Vital Signs: Temp: 98 F (36.7 C) (10/04 2001) Temp Source: Oral (10/04 1719) BP: 129/93 (10/04 2001) Pulse Rate: 103 (10/04 2001)  Labs: Recent Labs    11/03/20 0145 11/03/20 0400 11/03/20 1201 11/04/20 0156  HGB 11.7*  --   --  10.9*  HCT 36.1  --   --  33.7*  PLT 208  --   --  182  LABPROT  --   --   --  15.2  INR  --   --   --  1.2  HEPARINUNFRC  --   --  1.01* 0.65  CREATININE 1.00  --   --  0.98  TROPONINIHS 4 4  --   --    Estimated Creatinine Clearance: 79.9 mL/min (by C-G formula based on SCr of 0.98 mg/dL).  Assessment: 60 y.o. F presents with SOB. Found to have multiple b/l PE without R heart strain on CT. To begin heparin per pharmacy. CBC ok on admission. No AC PTA.  Heparin level 0.65 (therapeutic) on gtt at 1300 units/hr. No bleeding noted.  Goal of Therapy:  Heparin level 0.3-0.7 units/ml Monitor platelets by anticoagulation protocol: Yes   Plan:  Continue heparin at 1300 units/hr F/u 6 hr confirmatory heparin level  Sherlon Handing, PharmD, BCPS Please see amion for complete clinical pharmacist phone list 11/04/2020 2:46 AM

## 2020-11-04 NOTE — Progress Notes (Signed)
ANTICOAGULATION CONSULT NOTE  Pharmacy Consult for Heparin Indication: pulmonary embolus  Allergies  Allergen Reactions   Sulfa Antibiotics Hives    Patient Measurements: Height: 5\' 6"  (167.6 cm) Weight: 122.5 kg (270 lb 1 oz) IBW/kg (Calculated) : 59.3  Heparin Dosing Weight: 88 kg  Vital Signs: BP: 124/72 (10/05 0555) Pulse Rate: 62 (10/05 0808)  Labs: Recent Labs    11/03/20 0145 11/03/20 0400 11/03/20 1201 11/04/20 0156 11/04/20 0851  HGB 11.7*  --   --  10.9*  --   HCT 36.1  --   --  33.7*  --   PLT 208  --   --  182  --   LABPROT  --   --   --  15.2  --   INR  --   --   --  1.2  --   HEPARINUNFRC  --   --  1.01* 0.65 0.83*  CREATININE 1.00  --   --  0.98  --   TROPONINIHS 4 4  --   --   --     Estimated Creatinine Clearance: 80.5 mL/min (by C-G formula based on SCr of 0.98 mg/dL).   Medications:  Scheduled:   calcium carbonate  1 tablet Oral BID   influenza vac split quadrivalent PF  0.5 mL Intramuscular Tomorrow-1000   metoprolol succinate  25 mg Oral Daily   potassium chloride  40 mEq Oral Daily   umeclidinium bromide  1 puff Inhalation Daily   Infusions:   heparin 1,300 Units/hr (11/03/20 1711)    Assessment: 60 y.o. F presents with SOB. Found to have multiple bilateral PEs without R heart strain on CT. No anticoagulation PTA. Pharmacy consulted to manage heparin infusion.   CBC stable, no s/sx bleeding. Heparin level elevated @ 0.83. Will decrease heparin infusion rate and recheck heparin level in 6 hours.   Goal of Therapy:  Heparin level 0.3-0.7 units/ml Monitor platelets by anticoagulation protocol: Yes   Plan:  Decrease heparin infusion to 1100 units/hr Check heparin level in 6 hours and daily while on heparin Continue to monitor H&H and platelets   Thank you for allowing pharmacy to be a part of this patient's care.  Ardyth Harps, PharmD Clinical Pharmacist

## 2020-11-04 NOTE — Progress Notes (Signed)
PROGRESS NOTE    Rynn Markiewicz  PYK:[id number] DOB: March 03, 1960 DOA: 11/02/2020 PCP: Pcp, No  Brief Narrative: 60 year old female with morbid obesity, osteoarthritis of her knees, hypertension, asthma presented to the ED with shortness of breath with exertion for the last few weeks which progressively worsened. -She was seen in urgent care yesterday, CT chest noted multiple bilateral PE and 12 mm left lung nodule   Assessment & Plan:   Acute bilateral PE -Risk factors likely obesity, relative less mobility given osteoarthritis, also has a lung nodule which needs further work-up -Appears tachypneic, with intermittent episodes of tachycardia -Continue IV heparin today, monitor on telemetry -2D echo to check for right heart strain -Transition to oral Eliquis/Xarelto later today or in a.m. depending on echo findings  Left lung nodule -Non-smoker, needs follow-up, will send pulmonary referral at discharge  Hypertension -Continue Toprol, hold lisinopril  History of osteoarthritis -Tylenol, supportive care  DVT prophylaxis: IV heparin Code Status: Full code Family Communication: Discussed with patient in detail, no family at bedside Disposition Plan:  Status is: Inpatient  Remains inpatient appropriate because:Inpatient level of care appropriate due to severity of illness  Dispo: The patient is from: Home              Anticipated d/c is to: Home              Patient currently is not medically stable to d/c.   Difficult to place patient No   Consultants:    Procedures:   Antimicrobials:    Subjective: -Breathing improving, short of breath with minimal activity or exertion  Objective: Vitals:   11/03/20 2001 11/04/20 0500 11/04/20 0555 11/04/20 0808  BP: (!) 129/93  124/72   Pulse: (!) 103  87 62  Resp: 19  16 18   Temp: 98 F (36.7 C)     TempSrc:      SpO2: 99%   99%  Weight:  122.5 kg    Height:        Intake/Output Summary (Last 24 hours) at 11/04/2020  1114 Last data filed at 11/04/2020 0305 Gross per 24 hour  Intake 128.63 ml  Output --  Net 128.63 ml   Filed Weights   11/02/20 2027 11/04/20 0500  Weight: 121.1 kg 122.5 kg    Examination:  General exam: Obese pleasant female sitting up in bed, AAOx3, no distress HEENT: No JVD CVS: S1-S2, regular rhythm, mildly tachycardic Lungs: Decreased breath sounds to bases Abdomen: Soft, nontender, bowel sounds present Extremities: No edema Skin: No rash on exposed skin   Data Reviewed:   CBC: Recent Labs  Lab 11/03/20 0145 11/04/20 0156  WBC 7.8 6.4  NEUTROABS 5.5  --   HGB 11.7* 10.9*  HCT 36.1 33.7*  MCV 79.9* 79.3*  PLT 208 539   Basic Metabolic Panel: Recent Labs  Lab 11/03/20 0145 11/04/20 0156  NA 137 140  K 3.1* 3.9  CL 104 110  CO2 25 24  GLUCOSE 105* 95  BUN 13 12  CREATININE 1.00 0.98  CALCIUM 9.3 8.8*   GFR: Estimated Creatinine Clearance: 80.5 mL/min (by C-G formula based on SCr of 0.98 mg/dL). Liver Function Tests: Recent Labs  Lab 11/03/20 0145 11/04/20 0156  AST 16 15  ALT 12 11  ALKPHOS 73 61  BILITOT 0.2* 0.5  PROT 7.1 6.1*  ALBUMIN 3.6 2.9*   No results for input(s): LIPASE, AMYLASE in the last 168 hours. No results for input(s): AMMONIA in the last 168 hours. Coagulation Profile: Recent  Labs  Lab 11/04/20 0156  INR 1.2   Cardiac Enzymes: No results for input(s): CKTOTAL, CKMB, CKMBINDEX, TROPONINI in the last 168 hours. BNP (last 3 results) No results for input(s): PROBNP in the last 8760 hours. HbA1C: No results for input(s): HGBA1C in the last 72 hours. CBG: No results for input(s): GLUCAP in the last 168 hours. Lipid Profile: No results for input(s): CHOL, HDL, LDLCALC, TRIG, CHOLHDL, LDLDIRECT in the last 72 hours. Thyroid Function Tests: No results for input(s): TSH, T4TOTAL, FREET4, T3FREE, THYROIDAB in the last 72 hours. Anemia Panel: No results for input(s): VITAMINB12, FOLATE, FERRITIN, TIBC, IRON, RETICCTPCT in  the last 72 hours. Urine analysis: No results found for: COLORURINE, APPEARANCEUR, LABSPEC, PHURINE, GLUCOSEU, HGBUR, BILIRUBINUR, KETONESUR, PROTEINUR, UROBILINOGEN, NITRITE, LEUKOCYTESUR Sepsis Labs: @LABRCNTIP (procalcitonin:4,lacticidven:4)  ) Recent Results (from the past 240 hour(s))  Resp Panel by RT-PCR (Flu A&B, Covid) Nasopharyngeal Swab     Status: None   Collection Time: 11/03/20  4:00 AM   Specimen: Nasopharyngeal Swab; Nasopharyngeal(NP) swabs in vial transport medium  Result Value Ref Range Status   SARS Coronavirus 2 by RT PCR NEGATIVE NEGATIVE Final    Comment: (NOTE) SARS-CoV-2 target nucleic acids are NOT DETECTED.  The SARS-CoV-2 RNA is generally detectable in upper respiratory specimens during the acute phase of infection. The lowest concentration of SARS-CoV-2 viral copies this assay can detect is 138 copies/mL. A negative result does not preclude SARS-Cov-2 infection and should not be used as the sole basis for treatment or other patient management decisions. A negative result may occur with  improper specimen collection/handling, submission of specimen other than nasopharyngeal swab, presence of viral mutation(s) within the areas targeted by this assay, and inadequate number of viral copies(<138 copies/mL). A negative result must be combined with clinical observations, patient history, and epidemiological information. The expected result is Negative.  Fact Sheet for Patients:  EntrepreneurPulse.com.au  Fact Sheet for Healthcare Providers:  IncredibleEmployment.be  This test is no t yet approved or cleared by the [place] FDA and  has been authorized for detection and/or diagnosis of SARS-CoV-2 by FDA under an Emergency Use Authorization (EUA). This EUA will remain  in effect (meaning this test can be used) for the duration of the COVID-19 declaration under Section 564(b)(1) of the Act, 21 U.S.C.section  360bbb-3(b)(1), unless the authorization is terminated  or revoked sooner.       Influenza A by PCR NEGATIVE NEGATIVE Final   Influenza B by PCR NEGATIVE NEGATIVE Final    Comment: (NOTE) The Xpert Xpress SARS-CoV-2/FLU/RSV plus assay is intended as an aid in the diagnosis of influenza from Nasopharyngeal swab specimens and should not be used as a sole basis for treatment. Nasal washings and aspirates are unacceptable for Xpert Xpress SARS-CoV-2/FLU/RSV testing.  Fact Sheet for Patients: EntrepreneurPulse.com.au  Fact Sheet for Healthcare Providers: IncredibleEmployment.be  This test is not yet approved or cleared by the [place] FDA and has been authorized for detection and/or diagnosis of SARS-CoV-2 by FDA under an Emergency Use Authorization (EUA). This EUA will remain in effect (meaning this test can be used) for the duration of the COVID-19 declaration under Section 564(b)(1) of the Act, 21 U.S.C. section 360bbb-3(b)(1), unless the authorization is terminated or revoked.  Performed at [facility], [address], [place], [place] [address]          Radiology Studies: CT Angio Chest PE W and/or Wo Contrast  Result Date: 11/03/2020 CLINICAL DATA:  Shortness of breath EXAM: CT  ANGIOGRAPHY CHEST WITH CONTRAST TECHNIQUE: Multidetector CT imaging of the chest was performed using the standard protocol during bolus administration of intravenous contrast. Multiplanar CT image reconstructions and MIPs were obtained to evaluate the vascular anatomy. CONTRAST:  129mL OMNIPAQUE IOHEXOL 350 MG/ML SOLN COMPARISON:  None. FINDINGS: Cardiovascular: Thoracic aorta demonstrates atherosclerotic calcifications without aneurysmal dilatation. No findings to suggest dissection are noted. Heart is enlarged in size. Pulmonary artery is well visualized and demonstrates multiple bilateral filling defects consistent with bilateral pulmonary emboli.  No evidence of right heart strain is noted. No coronary calcifications are seen. Mediastinum/Nodes: Thoracic inlet is within normal limits. No sizable hilar or mediastinal adenopathy is noted. The esophagus as visualized is within normal limits. Lungs/Pleura: 12 mm nodule is noted in the left posterior costophrenic angle. No focal infiltrate or sizable effusion is seen. Upper Abdomen: Visualized upper abdomen shows evidence of prior cholecystectomy. No acute abnormality is noted. Musculoskeletal: Degenerative changes of the thoracic spine are noted. No acute rib abnormality is seen. Review of the MIP images confirms the above findings. IMPRESSION: Multiple bilateral pulmonary emboli without evidence of right heart strain. 12 mm left solid pulmonary nodule. Given the pulmonary emboli this could represent early changes of a Hampton's hump although a non-contrast Chest CT at 3 months is recommended. These guidelines do not apply to immunocompromised patients and patients with cancer. Follow up in patients with significant comorbidities as clinically warranted. For lung cancer screening, adhere to Lung-RADS guidelines. Reference: Radiology. 2017; 284(1):228-43. No other focal abnormality is noted. Aortic Atherosclerosis (ICD10-I70.0). Critical Value/emergent results were called by telephone at the time of interpretation on 11/03/2020 at 3:18 am to Dr. Veryl Speak , who verbally acknowledged these results. Electronically Signed   By: Inez Catalina M.D.   On: 11/03/2020 03:19   DG Chest Port 1 View  Result Date: 11/03/2020 CLINICAL DATA:  Shortness of breath when ambulating x1 day. EXAM: PORTABLE CHEST 1 VIEW COMPARISON:  None. FINDINGS: The heart size and mediastinal contours are within normal limits. Both lungs are clear. Mild degenerative changes seen within the mid to lower thoracic spine and bilateral shoulders. IMPRESSION: No active disease. Electronically Signed   By: Virgina Norfolk M.D.   On: 11/03/2020  01:47        Scheduled Meds:  calcium carbonate  1 tablet Oral BID   influenza vac split quadrivalent PF  0.5 mL Intramuscular Tomorrow-1000   metoprolol succinate  25 mg Oral Daily   potassium chloride  40 mEq Oral Daily   umeclidinium bromide  1 puff Inhalation Daily   Continuous Infusions:  heparin 1,300 Units/hr (11/03/20 1711)     LOS: 1 day    Time spent: 46min    Domenic Polite, MD [facility]   11/04/2020, 11:14 AM

## 2020-11-04 NOTE — Progress Notes (Signed)
  Echocardiogram 2D Echocardiogram has been performed.  Alyssa [name] 11/04/2020, 2:24 PM

## 2020-11-05 DIAGNOSIS — R911 Solitary pulmonary nodule: Secondary | ICD-10-CM

## 2020-11-05 LAB — CBC
HCT: 33.2 % — ABNORMAL LOW (ref 36.0–46.0)
Hemoglobin: 10.5 g/dL — ABNORMAL LOW (ref 12.0–15.0)
MCH: 25.5 pg — ABNORMAL LOW (ref 26.0–34.0)
MCHC: 31.6 g/dL (ref 30.0–36.0)
MCV: 80.8 fL (ref 80.0–100.0)
Platelets: 185 10*3/uL (ref 150–400)
RBC: 4.11 MIL/uL (ref 3.87–5.11)
RDW: 15 % (ref 11.5–15.5)
WBC: 6.5 10*3/uL (ref 4.0–10.5)
nRBC: 0 % (ref 0.0–0.2)

## 2020-11-05 LAB — BASIC METABOLIC PANEL
Anion gap: 7 (ref 5–15)
Anion gap: 8 (ref 5–15)
BUN: 16 mg/dL (ref 8–23)
BUN: 12 mg/dL (ref 8–23)
CO2: 23 mmol/L (ref 22–32)
CO2: 24 mmol/L (ref 22–32)
Calcium: 8.7 mg/dL — ABNORMAL LOW (ref 8.9–10.3)
Calcium: 9 mg/dL (ref 8.9–10.3)
Chloride: 110 mmol/L (ref 98–111)
Chloride: 110 mmol/L (ref 98–111)
Creatinine, Ser: 1.38 mg/dL — ABNORMAL HIGH (ref 0.44–1.00)
Creatinine, Ser: 1.2 mg/dL — ABNORMAL HIGH (ref 0.44–1.00)
GFR, Estimated: 44 mL/min — ABNORMAL LOW (ref >60)
GFR, Estimated: 52 mL/min — ABNORMAL LOW (ref >60)
Glucose, Bld: 102 mg/dL — ABNORMAL HIGH (ref 70–99)
Glucose, Bld: 99 mg/dL (ref 70–99)
Potassium: 4 mmol/L (ref 3.5–5.1)
Potassium: 3.6 mmol/L (ref 3.5–5.1)
Sodium: 140 mmol/L (ref 135–145)
Sodium: 142 mmol/L (ref 135–145)

## 2020-11-05 LAB — HEPARIN LEVEL (UNFRACTIONATED): Heparin Unfractionated: 0.48 IU/mL (ref 0.30–0.70)

## 2020-11-05 MED ORDER — UMECLIDINIUM BROMIDE 62.5 MCG/INH IN AEPB: 1.0000 | INHALATION_SPRAY | Freq: Every day | RESPIRATORY_TRACT | 1 refills | Status: DC

## 2020-11-05 MED ORDER — RIVAROXABAN 20 MG PO TABS: 20.0000 mg | ORAL_TABLET | Freq: Every day | ORAL | Status: DC

## 2020-11-05 MED ORDER — RIVAROXABAN (XARELTO) VTE STARTER PACK (15 & 20 MG): ORAL_TABLET | ORAL | 3 refills | Status: DC

## 2020-11-05 MED ORDER — RIVAROXABAN 15 MG PO TABS
15.0000 mg | ORAL_TABLET | Freq: Two times a day (BID) | ORAL | Status: DC
  Administered 2020-11-05 (×2): 15 mg via ORAL
  Filled 2020-11-05 (×4): qty 1

## 2020-11-05 MED ORDER — ACETAMINOPHEN 325 MG PO TABS: 650.0000 mg | ORAL_TABLET | Freq: Four times a day (QID) | ORAL | Status: AC | PRN

## 2020-11-05 NOTE — Discharge Summary (Signed)
Physician Discharge Summary  Alyssa [name] TDS:[id number] DOB: 08/08/1960 DOA: 11/02/2020  PCP: Pcp, No  Admit date: 11/02/2020 Discharge date: 11/05/2020  Time spent: 60 minutes  Recommendations for Outpatient Follow-up:  Continue Xarelto for at least 6 months Follow-up PCP 2 weeks Follow-up pulmonology in 1 month   Discharge Diagnoses:  Active Problems:   Pulmonary embolism (HCC)   Pulmonary emboli (HCC)   Lung nodule   Discharge Condition: Stable  Diet recommendation: Regular diet  Filed Weights   11/02/20 2027 11/04/20 0500 11/05/20 0500  Weight: 121.1 kg 122.5 kg 123.5 kg    History of present illness:  60 year old female with a history of morbid obesity, osteoarthritis of her knees, hypertension, asthma presents to ED with complaints of shortness of breath with exertion which has progressively worsened.  She was seen in the urgent care at that time CT chest showed multiple bilateral PE and 12 mm left lung nodule.  Hospital Course:   Acute bilateral PE/right leg DVT -Risk factors include obesity -Echocardiogram was obtained which showed EF of 65 to 26%, grade 1 diastolic dysfunction, right ventricle function is hyperdynamic.  Right ventricular size normal.  Normal pulmonary artery systolic pressure. -Patient was started on IV heparin per pharmacy -We will switch her to Xarelto today -Needs to follow-up with PCP, will need to be on Xarelto at least for 6 months.  Lung nodule -Patient is non-smoker -Needs pulmonology follow-up at discharge -Referral has been sent for outpatient appointment  Hypertension -Continue Toprol-XL, lisinopril -We will discontinue HCTZ  History of asthma -Patient has improved with Incruse Ellipta inhalation -Continue Incruse Ellipta 1 puff elation daily  Procedures: Echocardiogram  Consultations:   Discharge Exam: Vitals:   11/05/20 0753 11/05/20 1207  BP:  124/70  Pulse:  83  Resp:    Temp:  98.1 F (36.7 C)  SpO2: 97%  98%    General: Appears in no acute distress Cardiovascular: S1-S2, regular Respiratory: Clear to auscultation bilaterally  Discharge Instructions   Discharge Instructions     Ambulatory referral to Pulmonology   Complete by: As directed    In 4-6 weeks   Reason for referral: Lung Mass/Lung Nodule   Diet - low sodium heart healthy   Complete by: As directed    Discharge instructions   Complete by: As directed    You will need anticoagulation with Xarelto for atleast 6 months. Make sure you get refills for Xarelto from your PCP.   Increase activity slowly   Complete by: As directed       Allergies as of 11/05/2020       Reactions   Sulfa Antibiotics Hives        Medication List     STOP taking these medications    celecoxib 100 MG capsule Commonly known as: CELEBREX   hydrochlorothiazide 12.5 MG tablet Commonly known as: HYDRODIURIL       TAKE these medications    acetaminophen 325 MG tablet Commonly known as: TYLENOL Take 2 tablets (650 mg total) by mouth every 6 (six) hours as needed for mild pain or headache.   albuterol 108 (90 Base) MCG/ACT inhaler Commonly known as: VENTOLIN HFA Inhale 2 puffs into the lungs every 6 (six) hours as needed for wheezing or shortness of breath.   Azelastine HCl 137 MCG/SPRAY Soln Place 1 spray into both nostrils 2 (two) times daily as needed (nasal congestion).   lisinopril 10 MG tablet Commonly known as: ZESTRIL Take 10 mg by mouth daily.   metoprolol  succinate 25 MG 24 hr tablet Commonly known as: TOPROL-XL Take 25 mg by mouth daily.   Rivaroxaban Stater Pack (15 mg and 20 mg) Commonly known as: XARELTO STARTER PACK Follow package directions: Take one 15mg  tablet by mouth twice a day. On day 22, switch to one 20mg  tablet once a day. Take with food.   umeclidinium bromide 62.5 MCG/INH Aepb Commonly known as: INCRUSE ELLIPTA Inhale 1 puff into the lungs daily. Start taking on: November 06, 2020        Allergies  Allergen Reactions   Sulfa Antibiotics Hives    Follow-up Information     [facility]. Go in 1 month(s).   Why: referral sent                 The results of significant diagnostics from this hospitalization (including imaging, microbiology, ancillary and laboratory) are listed below for reference.    Significant Diagnostic Studies: CT Angio Chest PE W and/or Wo Contrast  Result Date: 11/03/2020 CLINICAL DATA:  Shortness of breath EXAM: CT ANGIOGRAPHY CHEST WITH CONTRAST TECHNIQUE: Multidetector CT imaging of the chest was performed using the standard protocol during bolus administration of intravenous contrast. Multiplanar CT image reconstructions and MIPs were obtained to evaluate the vascular anatomy. CONTRAST:  167mL OMNIPAQUE IOHEXOL 350 MG/ML SOLN COMPARISON:  None. FINDINGS: Cardiovascular: Thoracic aorta demonstrates atherosclerotic calcifications without aneurysmal dilatation. No findings to suggest dissection are noted. Heart is enlarged in size. Pulmonary artery is well visualized and demonstrates multiple bilateral filling defects consistent with bilateral pulmonary emboli. No evidence of right heart strain is noted. No coronary calcifications are seen. Mediastinum/Nodes: Thoracic inlet is within normal limits. No sizable hilar or mediastinal adenopathy is noted. The esophagus as visualized is within normal limits. Lungs/Pleura: 12 mm nodule is noted in the left posterior costophrenic angle. No focal infiltrate or sizable effusion is seen. Upper Abdomen: Visualized upper abdomen shows evidence of prior cholecystectomy. No acute abnormality is noted. Musculoskeletal: Degenerative changes of the thoracic spine are noted. No acute rib abnormality is seen. Review of the MIP images confirms the above findings. IMPRESSION: Multiple bilateral pulmonary emboli without evidence of right heart strain. 12 mm left solid pulmonary nodule. Given the pulmonary emboli this  could represent early changes of a Hampton's hump although a non-contrast Chest CT at 3 months is recommended. These guidelines do not apply to immunocompromised patients and patients with cancer. Follow up in patients with significant comorbidities as clinically warranted. For lung cancer screening, adhere to Lung-RADS guidelines. Reference: Radiology. 2017; 284(1):228-43. No other focal abnormality is noted. Aortic Atherosclerosis (ICD10-I70.0). Critical Value/emergent results were called by telephone at the time of interpretation on 11/03/2020 at 3:18 am to Dr. Veryl Speak , who verbally acknowledged these results. Electronically Signed   By: Inez Catalina M.D.   On: 11/03/2020 03:19   DG Chest Port 1 View  Result Date: 11/03/2020 CLINICAL DATA:  Shortness of breath when ambulating x1 day. EXAM: PORTABLE CHEST 1 VIEW COMPARISON:  None. FINDINGS: The heart size and mediastinal contours are within normal limits. Both lungs are clear. Mild degenerative changes seen within the mid to lower thoracic spine and bilateral shoulders. IMPRESSION: No active disease. Electronically Signed   By: Virgina Norfolk M.D.   On: 11/03/2020 01:47   ECHOCARDIOGRAM COMPLETE  Result Date: 11/04/2020    ECHOCARDIOGRAM REPORT   Patient Name:   Alyssa [name] Date of Exam: 11/04/2020 Medical Rec #:  [id number]  Height:       66.0 in Accession #:    [id number]         Weight:       270.1 lb Date of Birth:  12-13-1960           BSA:          2.272 m Patient Age:    65 years           BP:           108/69 mmHg Patient Gender: F                  HR:           92 bpm. Exam Location:  Inpatient Procedure: 2D Echo, 3D Echo, Cardiac Doppler and Color Doppler Indications:    R06.02 SOB; I26.02 Pulmonary embolus  History:        Patient has no prior history of Echocardiogram examinations.                 Risk Factors:Hypertension. No previous cardiac history.  Sonographer:    Roseanna Rainbow RDCS Referring Phys: [phone] [facility]   Sonographer Comments: Technically difficult study due to poor echo windows and patient is morbidly obese. Image acquisition challenging due to patient body habitus. IMPRESSIONS  1. Left ventricular ejection fraction, by estimation, is 65 to 70%. The left ventricle has normal function. The left ventricle has no regional wall motion abnormalities. Left ventricular diastolic parameters are consistent with Grade I diastolic dysfunction (impaired relaxation).  2. Right ventricular systolic function is hyperdynamic. The right ventricular size is normal. There is normal pulmonary artery systolic pressure.  3. The mitral valve is grossly normal. No evidence of mitral valve regurgitation.  4. The aortic valve was not well visualized. Aortic valve regurgitation is not visualized.  5. The inferior vena cava is normal in size with greater than 50% respiratory variability, suggesting right atrial pressure of 3 mmHg. Comparison(s): No prior Echocardiogram. FINDINGS  Left Ventricle: Left ventricular ejection fraction, by estimation, is 65 to 70%. The left ventricle has normal function. The left ventricle has no regional wall motion abnormalities. The left ventricular internal cavity size was normal in size. There is  no left ventricular hypertrophy. Left ventricular diastolic parameters are consistent with Grade I diastolic dysfunction (impaired relaxation). Indeterminate filling pressures. Right Ventricle: The right ventricular size is normal. No increase in right ventricular wall thickness. Right ventricular systolic function is hyperdynamic. There is normal pulmonary artery systolic pressure. The tricuspid regurgitant velocity is 2.55 m/s, and with an assumed right atrial pressure of 3 mmHg, the estimated right ventricular systolic pressure is 29.9 mmHg. Left Atrium: Left atrial size was normal in size. Right Atrium: Right atrial size was normal in size. Pericardium: There is no evidence of pericardial effusion. Mitral Valve:  The mitral valve is grossly normal. No evidence of mitral valve regurgitation. Tricuspid Valve: The tricuspid valve is grossly normal. Tricuspid valve regurgitation is trivial. Aortic Valve: The aortic valve was not well visualized. Aortic valve regurgitation is not visualized. Pulmonic Valve: The pulmonic valve was grossly normal. Pulmonic valve regurgitation is trivial. Aorta: The aortic root and ascending aorta are structurally normal, with no evidence of dilitation. Venous: The inferior vena cava is normal in size with greater than 50% respiratory variability, suggesting right atrial pressure of 3 mmHg. IAS/Shunts: No atrial level shunt detected by color flow Doppler.  LEFT VENTRICLE PLAX 2D LVIDd:         3.80  cm     Diastology LVIDs:         2.40 cm     LV e' medial:   6.09 cm/s LV PW:         1.70 cm     LV E/e' medial: 15.1 LV IVS:        0.90 cm LVOT diam:     2.10 cm LV SV:         81 LV SV Index:   36 LVOT Area:     3.46 cm  LV Volumes (MOD) LV vol d, MOD A2C: 78.4 ml LV vol d, MOD A4C: 70.2 ml LV vol s, MOD A2C: 17.9 ml LV vol s, MOD A4C: 19.8 ml LV SV MOD A2C:     60.5 ml LV SV MOD A4C:     70.2 ml LV SV MOD BP:      57.4 ml RIGHT VENTRICLE             IVC RV S prime:     16.30 cm/s  IVC diam: 1.90 cm TAPSE (M-mode): 2.4 cm LEFT ATRIUM             Index       RIGHT ATRIUM           Index LA diam:        2.90 cm 1.28 cm/m  RA Area:     13.20 cm LA Vol (A2C):   19.8 ml 8.71 ml/m  RA Volume:   34.30 ml  15.10 ml/m LA Vol (A4C):   23.1 ml 10.17 ml/m LA Biplane Vol: 21.4 ml 9.42 ml/m  AORTIC VALVE LVOT Vmax:   152.00 cm/s LVOT Vmean:  99.700 cm/s LVOT VTI:    0.234 m  AORTA Ao Root diam: 3.30 cm Ao Asc diam:  2.70 cm MITRAL VALVE               TRICUSPID VALVE MV Area (PHT): 5.27 cm    TR Peak grad:   26.0 mmHg MV Decel Time: 144 msec    TR Vmax:        255.00 cm/s MV E velocity: 92.00 cm/s MV A velocity: 86.60 cm/s  SHUNTS MV E/A ratio:  1.06        Systemic VTI:  0.23 m                             Systemic Diam: 2.10 cm Lyman Bishop MD Electronically signed by Lyman Bishop MD Signature Date/Time: 11/04/2020/3:54:43 PM    Final    VAS [place] LOWER EXTREMITY VENOUS (DVT)  Result Date: 11/04/2020  Lower Venous DVT Study Patient Name:  Alyssa [name]  Date of Exam:   11/04/2020 Medical Rec #: [id number]           Accession #:    [id number] Date of Birth: July 07, 1960            Patient Gender: F Patient Age:   36 years Exam Location:  [facility] Procedure:      VAS [place] LOWER EXTREMITY VENOUS (DVT) Referring Phys: Nita Sells --------------------------------------------------------------------------------  Indications: PE, SOB, d-dimer.  Anticoagulation: Heparin. Comparison Study: No prior studies. Performing Technologist: Darlin Coco RDMS, RVT  Examination Guidelines: A complete evaluation includes B-mode imaging, spectral Doppler, color Doppler, and power Doppler as needed of all accessible portions of each vessel. Bilateral testing is considered an integral part of a complete examination. Limited examinations for reoccurring indications  may be performed as noted. The reflux portion of the exam is performed with the patient in reverse Trendelenburg.  +---------+---------------+---------+-----------+----------+-----------------+ RIGHT    CompressibilityPhasicitySpontaneityPropertiesThrombus Aging    +---------+---------------+---------+-----------+----------+-----------------+ CFV      Full           Yes      Yes                                    +---------+---------------+---------+-----------+----------+-----------------+ SFJ      Full                                                           +---------+---------------+---------+-----------+----------+-----------------+ FV Prox  Full                                                           +---------+---------------+---------+-----------+----------+-----------------+ FV Mid   Full                                                            +---------+---------------+---------+-----------+----------+-----------------+ FV DistalFull           Yes      Yes                                    +---------+---------------+---------+-----------+----------+-----------------+ PFV      Full                                                           +---------+---------------+---------+-----------+----------+-----------------+ POP      None           No       No                   Age Indeterminate +---------+---------------+---------+-----------+----------+-----------------+ PTV      Full                                                           +---------+---------------+---------+-----------+----------+-----------------+ PERO     Full                                                           +---------+---------------+---------+-----------+----------+-----------------+   +---------+---------------+---------+-----------+----------+--------------+ LEFT     CompressibilityPhasicitySpontaneityPropertiesThrombus Aging +---------+---------------+---------+-----------+----------+--------------+ CFV  Full           Yes      Yes                                 +---------+---------------+---------+-----------+----------+--------------+ SFJ      Full                                                        +---------+---------------+---------+-----------+----------+--------------+ FV Prox  Full                                                        +---------+---------------+---------+-----------+----------+--------------+ FV Mid   Full                                                        +---------+---------------+---------+-----------+----------+--------------+ FV DistalFull                                                        +---------+---------------+---------+-----------+----------+--------------+ PFV      Full                                                         +---------+---------------+---------+-----------+----------+--------------+ POP      Full           Yes      Yes                                 +---------+---------------+---------+-----------+----------+--------------+ PTV      Full                                                        +---------+---------------+---------+-----------+----------+--------------+ PERO     Full                                                        +---------+---------------+---------+-----------+----------+--------------+     Summary: RIGHT: - Findings consistent with age indeterminate deep vein thrombosis involving the right popliteal vein. - No cystic structure found in the popliteal fossa.  LEFT: - There is no evidence of deep vein thrombosis in the lower extremity.  - No cystic structure found in the popliteal fossa.  *See table(s) above for measurements and  observations. Electronically signed by Harold Barban MD on 11/04/2020 at 4:53:25 PM.    Final     Microbiology: Recent Results (from the past 240 hour(s))  Resp Panel by RT-PCR (Flu A&B, Covid) Nasopharyngeal Swab     Status: None   Collection Time: 11/03/20  4:00 AM   Specimen: Nasopharyngeal Swab; Nasopharyngeal(NP) swabs in vial transport medium  Result Value Ref Range Status   SARS Coronavirus 2 by RT PCR NEGATIVE NEGATIVE Final    Comment: (NOTE) SARS-CoV-2 target nucleic acids are NOT DETECTED.  The SARS-CoV-2 RNA is generally detectable in upper respiratory specimens during the acute phase of infection. The lowest concentration of SARS-CoV-2 viral copies this assay can detect is 138 copies/mL. A negative result does not preclude SARS-Cov-2 infection and should not be used as the sole basis for treatment or other patient management decisions. A negative result may occur with  improper specimen collection/handling, submission of specimen other than nasopharyngeal swab, presence of viral mutation(s) within the areas  targeted by this assay, and inadequate number of viral copies(<138 copies/mL). A negative result must be combined with clinical observations, patient history, and epidemiological information. The expected result is Negative.  Fact Sheet for Patients:  EntrepreneurPulse.com.au  Fact Sheet for Healthcare Providers:  IncredibleEmployment.be  This test is no t yet approved or cleared by the [place] FDA and  has been authorized for detection and/or diagnosis of SARS-CoV-2 by FDA under an Emergency Use Authorization (EUA). This EUA will remain  in effect (meaning this test can be used) for the duration of the COVID-19 declaration under Section 564(b)(1) of the Act, 21 U.S.C.section 360bbb-3(b)(1), unless the authorization is terminated  or revoked sooner.       Influenza A by PCR NEGATIVE NEGATIVE Final   Influenza B by PCR NEGATIVE NEGATIVE Final    Comment: (NOTE) The Xpert Xpress SARS-CoV-2/FLU/RSV plus assay is intended as an aid in the diagnosis of influenza from Nasopharyngeal swab specimens and should not be used as a sole basis for treatment. Nasal washings and aspirates are unacceptable for Xpert Xpress SARS-CoV-2/FLU/RSV testing.  Fact Sheet for Patients: EntrepreneurPulse.com.au  Fact Sheet for Healthcare Providers: IncredibleEmployment.be  This test is not yet approved or cleared by the [place] FDA and has been authorized for detection and/or diagnosis of SARS-CoV-2 by FDA under an Emergency Use Authorization (EUA). This EUA will remain in effect (meaning this test can be used) for the duration of the COVID-19 declaration under Section 564(b)(1) of the Act, 21 U.S.C. section 360bbb-3(b)(1), unless the authorization is terminated or revoked.  Performed at [facility], [address] [place], [place] [address]      Labs: Basic Metabolic Panel: Recent Labs  Lab  11/03/20 0145 11/04/20 0156 11/05/20 0227 11/05/20 0938  NA 137 140 140 142  K 3.1* 3.9 4.0 3.6  CL 104 110 110 110  CO2 25 24 23 24   GLUCOSE 105* 95 102* 99  BUN 13 12 16 12   CREATININE 1.00 0.98 1.38* 1.20*  CALCIUM 9.3 8.8* 8.7* 9.0   Liver Function Tests: Recent Labs  Lab 11/03/20 0145 11/04/20 0156  AST 16 15  ALT 12 11  ALKPHOS 73 61  BILITOT 0.2* 0.5  PROT 7.1 6.1*  ALBUMIN 3.6 2.9*   No results for input(s): LIPASE, AMYLASE in the last 168 hours. No results for input(s): AMMONIA in the last 168 hours. CBC: Recent Labs  Lab 11/03/20 0145 11/04/20 0156 11/05/20 0227  WBC 7.8 6.4 6.5  NEUTROABS 5.5  --   --   HGB 11.7* 10.9* 10.5*  HCT 36.1 33.7* 33.2*  MCV 79.9* 79.3* 80.8  PLT 208 182 185   Cardiac Enzymes: No results for input(s): CKTOTAL, CKMB, CKMBINDEX, TROPONINI in the last 168 hours. BNP: BNP (last 3 results) Recent Labs    11/03/20 0145  BNP 19.8     Signed:  Oswald Hillock MD.  [facility] 11/05/2020, 3:28 PM

## 2020-11-05 NOTE — Discharge Instructions (Signed)
Information on my medicine - XARELTO (rivaroxaban)  This medication education was reviewed with me or my healthcare representative as part of my discharge preparation.  WHY WAS XARELTO PRESCRIBED FOR YOU? Xarelto was prescribed to treat blood clots that may have been found in the veins of your legs (deep vein thrombosis) or in your lungs (pulmonary embolism) and to reduce the risk of them occurring again.  What do you need to know about Xarelto? The starting dose is one 15 mg tablet taken TWICE daily with food for the FIRST 21 DAYS then on 11/26/2020  the dose is changed to one 20 mg tablet taken ONCE A DAY with your evening meal.  DO NOT stop taking Xarelto without talking to the health care provider who prescribed the medication.  Refill your prescription for 20 mg tablets before you run out.  After discharge, you should have regular check-up appointments with your healthcare provider that is prescribing your Xarelto.  In the future your dose may need to be changed if your kidney function changes by a significant amount.  What do you do if you miss a dose? If you are taking Xarelto TWICE DAILY and you miss a dose, take it as soon as you remember. You may take two 15 mg tablets (total 30 mg) at the same time then resume your regularly scheduled 15 mg twice daily the next day.  If you are taking Xarelto ONCE DAILY and you miss a dose, take it as soon as you remember on the same day then continue your regularly scheduled once daily regimen the next day. Do not take two doses of Xarelto at the same time.   Important Safety Information Xarelto is a blood thinner medicine that can cause bleeding. You should call your healthcare provider right away if you experience any of the following: Bleeding from an injury or your nose that does not stop. Unusual colored urine (red or dark brown) or unusual colored stools (red or black). Unusual bruising for unknown reasons. A serious fall or if you  hit your head (even if there is no bleeding).  Some medicines may interact with Xarelto and might increase your risk of bleeding while on Xarelto. To help avoid this, consult your healthcare provider or pharmacist prior to using any new prescription or non-prescription medications, including herbals, vitamins, non-steroidal anti-inflammatory drugs (NSAIDs) and supplements.  This website has more information on Xarelto: [email or web address]-benson.[email or web address]

## 2020-11-05 NOTE — Progress Notes (Signed)
[place] for Rivaroxaban  Indication: pulmonary embolus  Allergies  Allergen Reactions   Sulfa Antibiotics Hives    Patient Measurements: Height: 5\' 6"  (167.6 cm) Weight: 123.5 kg (272 lb 4.3 oz) IBW/kg (Calculated) : 59.3   Vital Signs: Temp: 97.5 F (36.4 C) (10/06 0453) BP: 116/66 (10/06 0453) Pulse Rate: 76 (10/06 0453)  Labs: Recent Labs    11/03/20 0145 11/03/20 0400 11/03/20 1201 11/04/20 0156 11/04/20 0851 11/04/20 1743 11/05/20 0227  HGB 11.7*  --   --  10.9*  --   --  10.5*  HCT 36.1  --   --  33.7*  --   --  33.2*  PLT 208  --   --  182  --   --  185  LABPROT  --   --   --  15.2  --   --   --   INR  --   --   --  1.2  --   --   --   HEPARINUNFRC  --   --    < > 0.65 0.83* 0.43 0.48  CREATININE 1.00  --   --  0.98  --   --  1.38*  TROPONINIHS 4 4  --   --   --   --   --    < > = values in this interval not displayed.    Estimated Creatinine Clearance: 57.4 mL/min (A) (by C-G formula based on SCr of 1.38 mg/dL (H)).   Medical History: Past Medical History:  Diagnosis Date   Arthritis    Asthma    Hypertension     Medications:  Scheduled:   calcium carbonate  1 tablet Oral BID   influenza vac split quadrivalent PF  0.5 mL Intramuscular Tomorrow-1000   metoprolol succinate  25 mg Oral Daily   Rivaroxaban  15 mg Oral BID WC   Followed by   Derrill Memo ON 11/26/2020] rivaroxaban  20 mg Oral Q supper   umeclidinium bromide  1 puff Inhalation Daily   Infusions:   heparin 1,100 Units/hr (11/04/20 1216)    Assessment: 60 y.o. F presents with SOB. Found to have multiple bilateral PEs without R heart strain on CT. No anticoagulation PTA. Pharmacy consulted to transition heparin infusion to rivaroxaban. CBC stable, no s/sx bleeding.   Goal of Therapy:  Monitor platelets by anticoagulation protocol: Yes   Plan:  Stop heparin infusion @ 10:00 Start Rivaroxaban 15mg  twice daily x21 days, followed by 20mg  once  daily until completion of treatment Continue to monitor H&H and platelets     Thank you for allowing pharmacy to be a part of this patient's care.   Ardyth Harps, PharmD Clinical Pharmacist

## 2020-11-05 NOTE — Progress Notes (Signed)
Patient reported to this nurse that she ws informed by her pharmacy that Xarelto was out of stock. Patient was told by pharmacist that Macdona would hopefully be in stock tomorrow. Patient was very anxious and asked this nurse if she could be given 2 doses for tomorrow. Patient stated that if she did not get them, she felt that she would start to panic for missing a dose and would end up in the ED to receive dose. This nurse removed 2 doses of Xarelto from Pyx[facility] and provided them to the patient.

## 2020-11-05 NOTE — Progress Notes (Signed)
Patient ambulated approximately 1043ft on room air, maintaining O2 sats between 97-99%. Patient had some SOB but resolved after stopping and resting for approximately 30 seconds at a time, total of 3 resting periods. Able to carry conversation while ambulating. MD to be made aware, will continue to monitor.

## 2020-11-05 NOTE — Plan of Care (Signed)

## 2020-11-10 ENCOUNTER — Ambulatory Visit (INDEPENDENT_AMBULATORY_CARE_PROVIDER_SITE_OTHER): Payer: BC Managed Care – PPO | Admitting: Emergency Medicine

## 2020-11-10 ENCOUNTER — Other Ambulatory Visit: Payer: Self-pay

## 2020-11-10 ENCOUNTER — Encounter: Payer: Self-pay | Admitting: Emergency Medicine

## 2020-11-10 VITALS — BP 136/70 | HR 92 | Temp 98.7°F | Ht 66.0 in | Wt 272.0 lb

## 2020-11-10 DIAGNOSIS — Z86711 Personal history of pulmonary embolism: Secondary | ICD-10-CM | POA: Diagnosis not present

## 2020-11-10 DIAGNOSIS — Z09 Encounter for follow-up examination after completed treatment for conditions other than malignant neoplasm: Secondary | ICD-10-CM | POA: Diagnosis not present

## 2020-11-10 DIAGNOSIS — Z86718 Personal history of other venous thrombosis and embolism: Secondary | ICD-10-CM

## 2020-11-10 DIAGNOSIS — M170 Bilateral primary osteoarthritis of knee: Secondary | ICD-10-CM

## 2020-11-10 DIAGNOSIS — I10 Essential (primary) hypertension: Secondary | ICD-10-CM

## 2020-11-10 DIAGNOSIS — Z7901 Long term (current) use of anticoagulants: Secondary | ICD-10-CM

## 2020-11-10 DIAGNOSIS — I2699 Other pulmonary embolism without acute cor pulmonale: Secondary | ICD-10-CM | POA: Diagnosis not present

## 2020-11-10 DIAGNOSIS — J42 Unspecified chronic bronchitis: Secondary | ICD-10-CM

## 2020-11-10 LAB — CBC WITH DIFFERENTIAL/PLATELET
Basophils Absolute: 0 10*3/uL (ref 0.0–0.1)
Basophils Relative: 0.5 % (ref 0.0–3.0)
Eosinophils Absolute: 0.1 10*3/uL (ref 0.0–0.7)
Eosinophils Relative: 1.5 % (ref 0.0–5.0)
HCT: 37.3 % (ref 36.0–46.0)
Hemoglobin: 11.8 g/dL — ABNORMAL LOW (ref 12.0–15.0)
Lymphocytes Relative: 20.6 % (ref 12.0–46.0)
Lymphs Abs: 1.1 10*3/uL (ref 0.7–4.0)
MCHC: 31.5 g/dL (ref 30.0–36.0)
MCV: 79.5 fl (ref 78.0–100.0)
Monocytes Absolute: 0.5 10*3/uL (ref 0.1–1.0)
Monocytes Relative: 8.5 % (ref 3.0–12.0)
Neutro Abs: 3.9 10*3/uL (ref 1.4–7.7)
Neutrophils Relative %: 68.9 % (ref 43.0–77.0)
Platelets: 234 10*3/uL (ref 150.0–400.0)
RBC: 4.7 Mil/uL (ref 3.87–5.11)
RDW: 15.5 % (ref 11.5–15.5)
WBC: 5.6 10*3/uL (ref 4.0–10.5)

## 2020-11-10 LAB — COMPREHENSIVE METABOLIC PANEL
ALT: 12 U/L (ref 0–35)
AST: 18 U/L (ref 0–37)
Albumin: 3.8 g/dL (ref 3.5–5.2)
Alkaline Phosphatase: 64 U/L (ref 39–117)
BUN: 14 mg/dL (ref 6–23)
CO2: 29 mEq/L (ref 19–32)
Calcium: 10 mg/dL (ref 8.4–10.5)
Chloride: 106 mEq/L (ref 96–112)
Creatinine, Ser: 1 mg/dL (ref 0.40–1.20)
GFR: 60.88 mL/min (ref >60.00)
Glucose, Bld: 94 mg/dL (ref 70–99)
Potassium: 4.6 mEq/L (ref 3.5–5.1)
Sodium: 140 mEq/L (ref 135–145)
Total Bilirubin: 0.3 mg/dL (ref 0.2–1.2)
Total Protein: 7.2 g/dL (ref 6.0–8.3)

## 2020-11-10 LAB — HEMOGLOBIN A1C: Hgb A1c MFr Bld: 5.9 % (ref 4.6–6.5)

## 2020-11-10 MED ORDER — FLUTICASONE-SALMETEROL 100-50 MCG/ACT IN AEPB: 1.0000 | INHALATION_SPRAY | Freq: Two times a day (BID) | RESPIRATORY_TRACT | 3 refills | Status: DC

## 2020-11-10 NOTE — Progress Notes (Signed)
Alyssa [name] 60 y.o.   Chief Complaint  Patient presents with   Hospitalization Follow-up    Blood clot, new patient    HISTORY OF PRESENT ILLNESS: This is a 60 y.o. female here for hospital discharge follow-up.  Admitted on 11/02/2020 and discharged on 11/05/2020 due to bilateral pulmonary emboli from DVT right leg.  On Xarelto.  Doing well.  Has no complaints. Discharge summary as follows: Physician Discharge Summary  Alyssa [name] ZGY:[id number] DOB: [birth year] DOA: 11/02/2020   PCP: Pcp, No   Admit date: 11/02/2020 Discharge date: 11/05/2020   Time spent: 60 minutes   Recommendations for Outpatient Follow-up:  Continue Xarelto for at least 6 months Follow-up PCP 2 weeks Follow-up pulmonology in 1 month     Discharge Diagnoses:  Active Problems:   Pulmonary embolism (HCC)   Pulmonary emboli (HCC)   Lung nodule     Discharge Condition: Stable   Diet recommendation: Regular diet  HPI   Prior to Admission medications   Medication Sig Start Date End Date Taking? Authorizing Provider  acetaminophen (TYLENOL) 325 MG tablet Take 2 tablets (650 mg total) by mouth every 6 (six) hours as needed for mild pain or headache. 11/05/20  Yes Oswald Hillock, MD  albuterol (VENTOLIN HFA) 108 (90 Base) MCG/ACT inhaler Inhale 2 puffs into the lungs every 6 (six) hours as needed for wheezing or shortness of breath. 10/16/20  Yes [provider]  lisinopril (ZESTRIL) 10 MG tablet Take 10 mg by mouth daily. 10/19/20  Yes [provider]  metoprolol succinate (TOPROL-XL) 25 MG 24 hr tablet Take 25 mg by mouth daily. 10/16/20  Yes [provider]  RIVAROXABAN Alyssa [name]) VTE STARTER PACK (15 & 20 MG) Follow package directions: Take one 15mg  tablet by mouth twice a day. On day 22, switch to one 20mg  tablet once a day. Take with food. 11/05/20  Yes Oswald Hillock, MD  umeclidinium bromide (INCRUSE ELLIPTA) 62.5 MCG/INH AEPB Inhale 1 puff into the lungs daily. 11/06/20   Yes Darrick Meigs, Marge Duncans, MD  Azelastine HCl 137 MCG/SPRAY SOLN Place 1 spray into both nostrils 2 (two) times daily as needed (nasal congestion). Patient not taking: Reported on 11/10/2020 10/27/20   [provider]    Allergies  Allergen Reactions   Other Hives   Sulfa Antibiotics Hives    Patient Active Problem List   Diagnosis Date Noted   Lung nodule 11/04/2020   Pulmonary embolism ([place]) 11/03/2020   Pulmonary emboli ([place]) 11/03/2020   Mild intermittent asthma without complication 59/16/3846   Anxiety 05/08/2018   Chronic cough 05/08/2018   Primary osteoarthritis of right knee 08/10/2017   Hyperlipidemia, mixed 08/09/2017   Iron deficiency anemia 08/09/2017   Obesity, morbid, BMI 40.0-49.9 ([place]) 08/09/2017   Sickle cell trait ([place]) 08/09/2017   Primary osteoarthritis of both knees 08/28/2014   Morbid obesity ([place]) 06/26/2014   Abnormal nuclear stress test 04/05/2013   Essential (primary) hypertension 05/17/2010   Vitamin D deficiency 11/27/2008   Asthma 09/03/2008   Esophageal reflux 09/03/2008   Leiomyoma of uterus 09/03/2008   Chronic bronchitis ([place]) 03/27/2008    Past Medical History:  Diagnosis Date   Arthritis    Asthma    Hypertension     Past Surgical History:  Procedure Laterality Date   CESAREAN SECTION     CHOLECYSTECTOMY     KNEE ARTHROSCOPY W/ MENISCAL REPAIR Left    LUMBAR [place] ARTHROPLASTY      Social History   Socioeconomic History  Marital status: Single    Spouse name: Not on file   Number of children: Not on file   Years of education: Not on file   Highest education level: Not on file  Occupational History   Not on file  Tobacco Use   Smoking status: Never   Smokeless tobacco: Never  Substance and Sexual Activity   Alcohol use: Never   Drug use: Never   Sexual activity: Not on file  Other Topics Concern   Not on file  Social History Narrative   Not on file   Social Determinants of Health   Financial Resource Strain:  Not on file  Food Insecurity: Not on file  Transportation Needs: Not on file  Physical Activity: Not on file  Stress: Not on file  Social Connections: Not on file  Intimate Partner Violence: Not on file    No family history on file.   Review of Systems  Constitutional: Negative.  Negative for chills and fever.  HENT: Negative.  Negative for congestion and sore throat.   Respiratory:  Positive for cough (Chronic cough). Negative for hemoptysis, sputum production and shortness of breath.   Cardiovascular:  Negative for chest pain and palpitations.  Gastrointestinal:  Negative for abdominal pain, blood in stool, diarrhea, melena, nausea and vomiting.  Genitourinary: Negative.  Negative for dysuria and hematuria.  Skin: Negative.  Negative for rash.  Neurological:  Negative for dizziness and headaches.  All other systems reviewed and are negative.  Today's Vitals   11/10/20 1028  BP: 136/70  Pulse: 92  Temp: 98.7 F (37.1 C)  TempSrc: Oral  SpO2: 95%  Weight: 272 lb (123.4 kg)  Height: 5\' 6"  (1.676 m)   Body mass index is 43.9 kg/m.  Physical Exam Vitals reviewed.  Constitutional:      Appearance: She is obese.  HENT:     Head: Normocephalic.  Eyes:     Extraocular Movements: Extraocular movements intact.     Pupils: Pupils are equal, round, and reactive to light.  Cardiovascular:     Rate and Rhythm: Normal rate and regular rhythm.     Pulses: Normal pulses.     Heart sounds: Normal heart sounds.  Pulmonary:     Effort: Pulmonary effort is normal.     Breath sounds: Normal breath sounds.  Musculoskeletal:     Cervical back: Normal range of motion and neck supple.  Skin:    General: Skin is warm and dry.     Capillary Refill: Capillary refill takes less than 2 seconds.  Neurological:     General: No focal deficit present.     Mental Status: She is alert and oriented to person, place, and time.  Psychiatric:        Mood and Affect: Mood normal.         Behavior: Behavior normal.     ASSESSMENT & PLAN: Problem List Items Addressed This Visit       Cardiovascular and Mediastinum   Pulmonary embolism on long-term anticoagulation therapy ([place])    Anticoagulation therapy discussed.  Fall precautions given.      Relevant Orders   Comprehensive metabolic panel   Hemoglobin A1c   CBC with Differential/Platelet   Essential (primary) hypertension    Well-controlled hypertension. BP Readings from Last 3 Encounters:  11/10/20 136/70  11/05/20 124/70  Continue lisinopril 10 mg daily and metoprolol succinate 25 mg daily.         Respiratory   Chronic bronchitis ([place])  Stable.  Present Ellipta nebulizer not covered by insurance.  Requesting Advair instead.      Relevant Medications   fluticasone-salmeterol (ADVAIR) 100-50 MCG/ACT AEPB     Musculoskeletal and Integument   Primary osteoarthritis of both knees    Stable.  Advised to avoid NSAIDs.  Tylenol as needed.        Other   Morbid obesity ([place])   Obesity, morbid, BMI 40.0-49.9 ([place])    Diet and nutrition discussed.  Advised to decrease amount of daily carbohydrate intake.      Other Visit Diagnoses     Hospital discharge follow-up    -  Primary   History of pulmonary embolism       History of DVT (deep vein thrombosis)          Patient Instructions  Pulmonary Embolism A pulmonary embolism (PE) is a sudden blockage or decrease of blood flow in one or both lungs that happens when a clot travels into the arteries of the lung (pulmonary arteries). Most blockages come from a blood clot that forms in the vein of a leg or arm (deep vein thrombosis, DVT) and travels to the lungs. A clot is blood that has thickened into a gel or solid. PE is a dangerous and life-threatening condition that needs to be treated right away. What are the causes? This condition is usually caused by a blood clot that forms in a vein and moves to the lungs. In rare cases, it may be caused by air,  fat, part of a tumor, or other tissue that moves through the veins and into the lungs. What increases the risk? The following factors may make you more likely to develop this condition: Experiencing a traumatic injury, such as breaking a hip or leg. Having: A spinal cord injury. Major surgery, especially hip or knee replacement, or surgery on parts of the nervous system or on the abdomen. A stroke. A blood-clotting disease. Long-term (chronic) lung or heart disease. Cancer, especially if you are being treated with chemotherapy. A central venous catheter. Taking medicines that contain estrogen. These include birth control pills and hormone replacement therapy. Being: Pregnant. In the period of time after your baby is delivered (postpartum). Older than age 6. Overweight. A smoker, especially if you have other risks. Not very active (sedentary), not being able to move at all, or spending long periods sitting, such as travel over 6 hours. You are also at a greater risk if you have a leg in a cast or splint. What are the signs or symptoms? Symptoms of this condition usually start suddenly and include: Shortness of breath during activity or at rest. Coughing, coughing up blood, or coughing up bloody mucus. Chest pain, back pain, or shoulder blade pain that gets worse with deep breaths. Rapid or irregular heartbeat. Feeling light-headed or dizzy, or fainting. Feeling anxious. Pain and swelling in a leg. This is a symptom of DVT, which can lead to PE. How is this diagnosed? This condition may be diagnosed based on your medical history, a physical exam, and tests. Tests may include: Blood tests. An ECG (electrocardiogram) of the heart. A CT pulmonary angiogram. This test checks blood flow in and around your lungs. A ventilation-perfusion scan, also called a lung VQ scan. This test measures air flow and blood flow to the lungs. An ultrasound to check for a DVT. How is this  treated? Treatment for this condition depends on many factors, such as the cause of your PE, your risk for  bleeding or developing more clots, and other medical conditions you may have. Treatment aims to stop blood clots from forming or growing larger. In some cases, treatment may be aimed at breaking apart or removing the blood clot. Treatment may include: Medicines, such as: Blood thinning medicines, also called anticoagulants, to stop clots from forming and growing. Medicines that break apart clots (fibrinolytics). Procedures, such as: Using a flexible tube to remove a blood clot (embolectomy) or to deliver medicine to destroy it (catheter-directed thrombolysis). Surgery to remove the clot (surgical embolectomy). This is rare. You may need a combination of immediate, long-term, and extended treatments. Your treatment may continue for several months (maintenance therapy) or longer depending on your medical conditions. You and your health care provider will work together to choose the treatment program that is best for you. Follow these instructions at home: Medicines Take over-the-counter and prescription medicines only as told by your health care provider. If you are taking blood thinners: Talk with your health care provider before you take any medicines that contain aspirin or NSAIDs, such as ibuprofen. These medicines increase your risk for dangerous bleeding. Take your medicine exactly as told, at the same time every day. Avoid activities that could cause injury or bruising, and follow instructions about how to prevent falls. Wear a medical alert bracelet or carry a card that lists what medicines you take. Understand what foods and drugs interact with any medicines that you are taking. General instructions Ask your health care provider when you may return to your normal activities. Avoid sitting or lying for a long time without moving. Maintain a healthy weight. Ask your health care provider  what weight is healthy for you. Do not use any products that contain nicotine or tobacco. These products include cigarettes, chewing tobacco, and vaping devices, such as e-cigarettes. If you need help quitting, ask your health care provider. Talk with your health care provider about any travel plans. It is important to make sure that you are still able to take your medicine while traveling. Keep all follow-up visits. This is important. Where to find more information American Lung Association: www.lung.org Centers for Disease Control and Prevention: http://www.wolf.info/ Contact a health care provider if: You missed a dose of your blood thinner medicine. You have a fever. Get help right away if: You have: New or increased pain, swelling, warmth, or redness in an arm or leg. Shortness of breath that gets worse during activity or at rest. Worsening chest pain. A rapid or irregular heartbeat. A severe headache. Vision changes. A serious fall or accident, or you hit your head. Blood in your vomit, stool, or urine. A cut that will not stop bleeding. You cough up blood. You feel light-headed or dizzy, and that feeling does not go away. You cannot move your arms or legs. You are confused or have memory loss. These symptoms may represent a serious problem that is an emergency. Do not wait to see if the symptoms will go away. Get medical help right away. Call your local emergency services (911 in the [place] Do not drive yourself to the hospital. Summary A pulmonary embolism (PE) is a serious and potentially life-threatening condition. It happens when a blood clot from one part of the body travels to the arteries of the lung, causing a sudden blockage or decrease of blood flow to the lungs. This may result in shortness of breath, chest pain, dizziness, and fainting. Treatments for this condition usually include medicines to thin your blood (anticoagulants) or  medicines to break apart blood clots. If you are  given blood thinners, take your medicine exactly as told by your health care provider, at the same time every day. This is important. Understand what foods and drugs interact with any medicines that you are taking. If you have signs of PE or DVT, call your local emergency services (911 in the [place] This information is not intended to replace advice given to you by your health care provider. Make sure you discuss any questions you have with your health care provider. Document Revised: 12/20/2019 Document Reviewed: 12/20/2019 Elsevier Patient Education  2022 [place], [place] [facility]

## 2020-11-10 NOTE — Assessment & Plan Note (Signed)
Stable.  On Xarelto.  Pulmonary appointment in 2 weeks.

## 2020-11-10 NOTE — Assessment & Plan Note (Signed)
Anticoagulation therapy discussed.  Fall precautions given.

## 2020-11-10 NOTE — Patient Instructions (Signed)
Pulmonary Embolism A pulmonary embolism (PE) is a sudden blockage or decrease of blood flow in one or both lungs that happens when a clot travels into the arteries of the lung (pulmonary arteries). Most blockages come from a blood clot that forms in the vein of a leg or arm (deep vein thrombosis, DVT) and travels to the lungs. A clot is blood that has thickened into a gel or solid. PE is a dangerous and life-threatening condition that needs to be treated right away. What are the causes? This condition is usually caused by a blood clot that forms in a vein and moves to the lungs. In rare cases, it may be caused by air, fat, part of a tumor, or other tissue that moves through the veins and into the lungs. What increases the risk? The following factors may make you more likely to develop this condition: Experiencing a traumatic injury, such as breaking a hip or leg. Having: A spinal cord injury. Major surgery, especially hip or knee replacement, or surgery on parts of the nervous system or on the abdomen. A stroke. A blood-clotting disease. Long-term (chronic) lung or heart disease. Cancer, especially if you are being treated with chemotherapy. A central venous catheter. Taking medicines that contain estrogen. These include birth control pills and hormone replacement therapy. Being: Pregnant. In the period of time after your baby is delivered (postpartum). Older than age 13. Overweight. A smoker, especially if you have other risks. Not very active (sedentary), not being able to move at all, or spending long periods sitting, such as travel over 6 hours. You are also at a greater risk if you have a leg in a cast or splint. What are the signs or symptoms? Symptoms of this condition usually start suddenly and include: Shortness of breath during activity or at rest. Coughing, coughing up blood, or coughing up bloody mucus. Chest pain, back pain, or shoulder blade pain that gets worse with deep  breaths. Rapid or irregular heartbeat. Feeling light-headed or dizzy, or fainting. Feeling anxious. Pain and swelling in a leg. This is a symptom of DVT, which can lead to PE. How is this diagnosed? This condition may be diagnosed based on your medical history, a physical exam, and tests. Tests may include: Blood tests. An ECG (electrocardiogram) of the heart. A CT pulmonary angiogram. This test checks blood flow in and around your lungs. A ventilation-perfusion scan, also called a lung VQ scan. This test measures air flow and blood flow to the lungs. An ultrasound to check for a DVT. How is this treated? Treatment for this condition depends on many factors, such as the cause of your PE, your risk for bleeding or developing more clots, and other medical conditions you may have. Treatment aims to stop blood clots from forming or growing larger. In some cases, treatment may be aimed at breaking apart or removing the blood clot. Treatment may include: Medicines, such as: Blood thinning medicines, also called anticoagulants, to stop clots from forming and growing. Medicines that break apart clots (fibrinolytics). Procedures, such as: Using a flexible tube to remove a blood clot (embolectomy) or to deliver medicine to destroy it (catheter-directed thrombolysis). Surgery to remove the clot (surgical embolectomy). This is rare. You may need a combination of immediate, long-term, and extended treatments. Your treatment may continue for several months (maintenance therapy) or longer depending on your medical conditions. You and your health care provider will work together to choose the treatment program that is best for you. Follow  these instructions at home: Medicines Take over-the-counter and prescription medicines only as told by your health care provider. If you are taking blood thinners: Talk with your health care provider before you take any medicines that contain aspirin or NSAIDs, such as  ibuprofen. These medicines increase your risk for dangerous bleeding. Take your medicine exactly as told, at the same time every day. Avoid activities that could cause injury or bruising, and follow instructions about how to prevent falls. Wear a medical alert bracelet or carry a card that lists what medicines you take. Understand what foods and drugs interact with any medicines that you are taking. General instructions Ask your health care provider when you may return to your normal activities. Avoid sitting or lying for a long time without moving. Maintain a healthy weight. Ask your health care provider what weight is healthy for you. Do not use any products that contain nicotine or tobacco. These products include cigarettes, chewing tobacco, and vaping devices, such as e-cigarettes. If you need help quitting, ask your health care provider. Talk with your health care provider about any travel plans. It is important to make sure that you are still able to take your medicine while traveling. Keep all follow-up visits. This is important. Where to find more information American Lung Association: www.lung.org Centers for Disease Control and Prevention: http://www.wolf.info/ Contact a health care provider if: You missed a dose of your blood thinner medicine. You have a fever. Get help right away if: You have: New or increased pain, swelling, warmth, or redness in an arm or leg. Shortness of breath that gets worse during activity or at rest. Worsening chest pain. A rapid or irregular heartbeat. A severe headache. Vision changes. A serious fall or accident, or you hit your head. Blood in your vomit, stool, or urine. A cut that will not stop bleeding. You cough up blood. You feel light-headed or dizzy, and that feeling does not go away. You cannot move your arms or legs. You are confused or have memory loss. These symptoms may represent a serious problem that is an emergency. Do not wait to see if the  symptoms will go away. Get medical help right away. Call your local emergency services (911 in the [place] Do not drive yourself to the hospital. Summary A pulmonary embolism (PE) is a serious and potentially life-threatening condition. It happens when a blood clot from one part of the body travels to the arteries of the lung, causing a sudden blockage or decrease of blood flow to the lungs. This may result in shortness of breath, chest pain, dizziness, and fainting. Treatments for this condition usually include medicines to thin your blood (anticoagulants) or medicines to break apart blood clots. If you are given blood thinners, take your medicine exactly as told by your health care provider, at the same time every day. This is important. Understand what foods and drugs interact with any medicines that you are taking. If you have signs of PE or DVT, call your local emergency services (911 in the [place] This information is not intended to replace advice given to you by your health care provider. Make sure you discuss any questions you have with your health care provider. Document Revised: 12/20/2019 Document Reviewed: 12/20/2019 Elsevier Patient Education  2022 [employer].

## 2020-11-10 NOTE — Assessment & Plan Note (Signed)
Stable.  Present Ellipta nebulizer not covered by insurance.  Requesting Advair instead.

## 2020-11-10 NOTE — Assessment & Plan Note (Signed)
Stable.  Advised to avoid NSAIDs.  Tylenol as needed.

## 2020-11-10 NOTE — Assessment & Plan Note (Signed)
Diet and nutrition discussed.  Advised to decrease amount of daily carbohydrate intake. 

## 2020-11-10 NOTE — Assessment & Plan Note (Addendum)
Well-controlled hypertension. BP Readings from Last 3 Encounters:  11/10/20 136/70  11/05/20 124/70  Continue lisinopril 10 mg daily and metoprolol succinate 25 mg daily.

## 2020-11-17 ENCOUNTER — Encounter: Payer: Self-pay | Admitting: Emergency Medicine

## 2020-11-18 ENCOUNTER — Other Ambulatory Visit: Payer: Self-pay | Admitting: Emergency Medicine

## 2020-11-18 NOTE — Telephone Encounter (Signed)
Recommend she talks to her pharmacist about this.  They should be able to look into what inhalers do not contain lactose.  Thanks.

## 2020-11-19 ENCOUNTER — Telehealth: Payer: Self-pay | Admitting: Emergency Medicine

## 2020-11-19 NOTE — Telephone Encounter (Signed)
Patient calling in upset about instructions she was given from pcp to speak w/ pharmacist regarding a particular medication  Says the pharmacist is not her doctor & should not be giving her medical advice related to her medications  Patient threatened to call [employer] if she does not receive a call back (also see previous messages below)  Requesting call back from supervisor

## 2020-11-19 NOTE — Telephone Encounter (Signed)
Sent pt mychart message

## 2020-11-20 ENCOUNTER — Encounter: Payer: Self-pay | Admitting: Emergency Medicine

## 2020-11-20 MED ORDER — SPIRIVA HANDIHALER 18 MCG IN CAPS: 18.0000 ug | ORAL_CAPSULE | Freq: Every day | RESPIRATORY_TRACT | 12 refills | Status: DC

## 2020-11-20 NOTE — Telephone Encounter (Signed)
Spoke with the patient and she is aware that Dr. Sharlet Salina has reviewed her chart and sent in a new medication to her pharmacy. She is aware that she can take lactaid with her new inhaler. She was appreciative for the call. No questions or concerns at this time

## 2020-11-20 NOTE — Telephone Encounter (Signed)
Have sent in spiriva instead of the wixela as this is closer to the incruse it was replacing. This does technically have small amounts of lactose as a propellant however there is good evidence that unless she has anaphylaxis to lactose this should not be a problem. Please advise her to use this unless anaphylaxis. Okay to take lactaid this will not interfere with this inhaler.

## 2020-11-20 NOTE — Telephone Encounter (Signed)
Patient called back after I left her a VM. Her attitude was condescending and supercilious as we discussed her concerns about her my chart messages regarding her medication, specifically Wixela, which is the fluticuasone-salmeterol ( Advair).  Patient states that "This office is playing phone game and avoiding speaking with her". She also states, " Her insurance company pays me and Dr. Mitchel Honour good money, so this type of care is unacceptable.  Patient states, " Why would Dr. Mitchel Honour involve the pharmacist in my medical care that dose not know my medical history". Explained to patient that a pharmacist is a specialist in medication and would have more information on which inhalers that dose not contain lactose like she requested. I explained to patient that, if the pharmacist suggested a brand that would accommodate her needs, that Dr. Mitchel Honour would not a problem with sending a new prescription. Calmly explain to the patient that the turn around time for medications and messages is 48-72 hours for response. I also explained to patient that we see a substantial amount of patients and that we try our best to accommodate patient concerns. Patient states, " I don't care about other patients". Patient states that I had an attitude as I was discussing what would be a solution to the issue at hand. I offered patient an appointment with another provider and gave her the option to choose another practice. She declined both advances and stated, " I am going to report all of you to the state board, bye". After speaking with patient I felt belittled and hurt from the comments made by patient.

## 2020-11-22 ENCOUNTER — Other Ambulatory Visit: Payer: Self-pay | Admitting: Emergency Medicine

## 2020-11-22 MED ORDER — RIVAROXABAN 20 MG PO TABS: 20.0000 mg | ORAL_TABLET | Freq: Every day | ORAL | 2 refills | Status: DC

## 2020-11-22 NOTE — Telephone Encounter (Signed)
Totally unacceptable behavior.  Recommend dismissal from this practice due to verbal abuse to staff member and threats of report to state boards with unreasonable complaints.  I personally prefer not be her primary care physician.  This lack of respect is not a good beginning to a healthy patient-physician relationship.  Recommend transfer of care to another physician.  Thanks.

## 2020-11-25 DIAGNOSIS — F330 Major depressive disorder, recurrent, mild: Secondary | ICD-10-CM | POA: Diagnosis not present

## 2020-11-25 DIAGNOSIS — F411 Generalized anxiety disorder: Secondary | ICD-10-CM | POA: Diagnosis not present

## 2020-11-26 ENCOUNTER — Encounter: Payer: Self-pay | Admitting: Emergency Medicine

## 2020-11-26 NOTE — Telephone Encounter (Signed)
I do not think so.

## 2020-11-27 ENCOUNTER — Encounter (HOSPITAL_COMMUNITY): Payer: Self-pay

## 2020-11-27 ENCOUNTER — Emergency Department (HOSPITAL_COMMUNITY)
Admission: EM | Admit: 2020-11-27 | Discharge: 2020-11-28 | Disposition: A | Discharge status: Home / Self Care | Payer: BC Managed Care – PPO | Attending: Emergency Medicine | Admitting: Emergency Medicine

## 2020-11-27 ENCOUNTER — Emergency Department (HOSPITAL_COMMUNITY): Payer: BC Managed Care – PPO

## 2020-11-27 DIAGNOSIS — J45909 Unspecified asthma, uncomplicated: Secondary | ICD-10-CM | POA: Insufficient documentation

## 2020-11-27 DIAGNOSIS — Z79899 Other long term (current) drug therapy: Secondary | ICD-10-CM | POA: Insufficient documentation

## 2020-11-27 DIAGNOSIS — R0789 Other chest pain: Secondary | ICD-10-CM | POA: Diagnosis not present

## 2020-11-27 DIAGNOSIS — J449 Chronic obstructive pulmonary disease, unspecified: Secondary | ICD-10-CM | POA: Diagnosis not present

## 2020-11-27 DIAGNOSIS — R079 Chest pain, unspecified: Secondary | ICD-10-CM | POA: Diagnosis not present

## 2020-11-27 DIAGNOSIS — Z7901 Long term (current) use of anticoagulants: Secondary | ICD-10-CM | POA: Insufficient documentation

## 2020-11-27 DIAGNOSIS — D649 Anemia, unspecified: Secondary | ICD-10-CM | POA: Insufficient documentation

## 2020-11-27 DIAGNOSIS — I10 Essential (primary) hypertension: Secondary | ICD-10-CM | POA: Insufficient documentation

## 2020-11-27 DIAGNOSIS — Z7951 Long term (current) use of inhaled steroids: Secondary | ICD-10-CM | POA: Diagnosis not present

## 2020-11-27 HISTORY — DX: Acute embolism and thrombosis of unspecified deep veins of unspecified lower extremity: I82.409

## 2020-11-27 HISTORY — DX: Other pulmonary embolism without acute cor pulmonale: I26.99

## 2020-11-27 LAB — COMPREHENSIVE METABOLIC PANEL
ALT: 11 U/L (ref 0–44)
AST: 14 U/L — ABNORMAL LOW (ref 15–41)
Albumin: 3.3 g/dL — ABNORMAL LOW (ref 3.5–5.0)
Alkaline Phosphatase: 72 U/L (ref 38–126)
Anion gap: 8 (ref 5–15)
BUN: 17 mg/dL (ref 8–23)
CO2: 22 mmol/L (ref 22–32)
Calcium: 8.9 mg/dL (ref 8.9–10.3)
Chloride: 108 mmol/L (ref 98–111)
Creatinine, Ser: 0.97 mg/dL (ref 0.44–1.00)
GFR, Estimated: >60 mL/min (ref >60)
Glucose, Bld: 98 mg/dL (ref 70–99)
Potassium: 3.9 mmol/L (ref 3.5–5.1)
Sodium: 138 mmol/L (ref 135–145)
Total Bilirubin: 0.5 mg/dL (ref 0.3–1.2)
Total Protein: 6.7 g/dL (ref 6.5–8.1)

## 2020-11-27 LAB — LIPASE, BLOOD: Lipase: 29 U/L (ref 11–51)

## 2020-11-27 LAB — CBC WITH DIFFERENTIAL/PLATELET
Abs Immature Granulocytes: 0.02 10*3/uL (ref 0.00–0.07)
Basophils Absolute: 0 10*3/uL (ref 0.0–0.1)
Basophils Relative: 0 %
Eosinophils Absolute: 0.1 10*3/uL (ref 0.0–0.5)
Eosinophils Relative: 1 %
HCT: 37.1 % (ref 36.0–46.0)
Hemoglobin: 11.8 g/dL — ABNORMAL LOW (ref 12.0–15.0)
Immature Granulocytes: 0 %
Lymphocytes Relative: 25 %
Lymphs Abs: 1.8 10*3/uL (ref 0.7–4.0)
MCH: 25.4 pg — ABNORMAL LOW (ref 26.0–34.0)
MCHC: 31.8 g/dL (ref 30.0–36.0)
MCV: 80 fL (ref 80.0–100.0)
Monocytes Absolute: 0.5 10*3/uL (ref 0.1–1.0)
Monocytes Relative: 7 %
Neutro Abs: 4.8 10*3/uL (ref 1.7–7.7)
Neutrophils Relative %: 67 %
Platelets: 277 10*3/uL (ref 150–400)
RBC: 4.64 MIL/uL (ref 3.87–5.11)
RDW: 14.9 % (ref 11.5–15.5)
WBC: 7.2 10*3/uL (ref 4.0–10.5)
nRBC: 0 % (ref 0.0–0.2)

## 2020-11-27 LAB — TROPONIN I (HIGH SENSITIVITY): Troponin I (High Sensitivity): 5 ng/L (ref <18)

## 2020-11-27 NOTE — ED Provider Notes (Signed)
Emergency Medicine Provider Triage Evaluation Note  Alyssa [name] , a 60 y.o. female  was evaluated in triage.  Pt complains of chest pain that moves around.  She has been compliant with her xarelto.  Her pain started about 4 hours PTA.  She has underlying costochondritis. She tried tylenol with out relief.   She thinks this may be her costochondritis but is unsure.   Review of Systems  Positive: Chest pain,  Negative: Fevers, vomiting  Physical Exam  BP (!) 152/96   Pulse 93   Temp 99.1 F (37.3 C) (Oral)   Resp 18   SpO2 100%  Gen:   Awake, no distress   Resp:  Normal effort  MSK:   Moves extremities without difficulty  Other:  Normal speech  Medical Decision Making  Medically screening exam initiated at 9:24 PM.  Appropriate orders placed.  Alyssa [name] was informed that the remainder of the evaluation will be completed by another provider, this initial triage assessment does not replace that evaluation, and the importance of remaining in the ED until their evaluation is complete.     Ollen Gross 11/27/20 2129    Lucrezia Starch, MD 11/28/20 [phone]

## 2020-11-27 NOTE — ED Triage Notes (Signed)
Pt states that she has central CP that started about 4 hours ago, pt has a DVT and PE that was diagnosed 27 days ago.

## 2020-11-28 ENCOUNTER — Encounter (HOSPITAL_COMMUNITY): Payer: Self-pay | Admitting: Student

## 2020-11-28 LAB — TROPONIN I (HIGH SENSITIVITY): Troponin I (High Sensitivity): 4 ng/L (ref <18)

## 2020-11-28 MED ORDER — LIDOCAINE 5 % EX PTCH: 1.0000 | MEDICATED_PATCH | Freq: Every day | CUTANEOUS | 0 refills | Status: DC | PRN

## 2020-11-28 MED ORDER — METHOCARBAMOL 500 MG PO TABS
500.0000 mg | ORAL_TABLET | Freq: Once | ORAL | Status: AC
  Administered 2020-11-28: 500 mg via ORAL
  Filled 2020-11-28: qty 1

## 2020-11-28 MED ORDER — METHOCARBAMOL 500 MG PO TABS: 500.0000 mg | ORAL_TABLET | Freq: Two times a day (BID) | ORAL | 0 refills | Status: DC | PRN

## 2020-11-28 MED ORDER — LIDOCAINE 5 % EX PTCH
1.0000 | MEDICATED_PATCH | CUTANEOUS | Status: DC
  Administered 2020-11-28: 1 via TRANSDERMAL
  Filled 2020-11-28: qty 1

## 2020-11-28 NOTE — Discharge Instructions (Signed)
You were seen in the emergency department today for chest pain. Your work-up in the emergency department has been overall reassuring. Your labs have been fairly normal and or similar to previous blood work you have had done. Your EKG and the enzyme we use to check your heart did not show an acute heart attack at this time. Your chest x-ray was normal.  We are sending you home with the following medicines to help with your discomfort:  - Lidoderm patch- apply 1 patch to your area of most significant pain once per day. Remove & discard patch within 12 hours of applications.   - Robaxin is the muscle relaxer I have prescribed, this is meant to help with muscle tightness. Be aware that this medication may make you drowsy therefore the first time you take this it should be at a time you are in an environment where you can rest. Do not drive or operate heavy machinery when taking this medication. Do not drink alcohol or take other sedating medications with this medicine such as narcotics or benzodiazepines.   You make take Tylenol per over the counter dosing with these medications.   We have prescribed you new medication(s) today. Discuss the medications prescribed today with your pharmacist as they can have adverse effects and interactions with your other medicines including over the counter and prescribed medications. Seek medical evaluation if you start to experience new or abnormal symptoms after taking one of these medicines, seek care immediately if you start to experience difficulty breathing, feeling of your throat closing, facial swelling, or rash as these could be indications of a more serious allergic reaction   We would like you to follow up closely with your primary care provider and/or the cardiologist provided in your discharge instructions within 1-3 days. Return to the ER immediately should you experience any new or worsening symptoms including but not limited to return of pain, worsened pain,  vomiting, shortness of breath, dizziness, lightheadedness, passing out, or any other concerns that you may have.

## 2020-11-28 NOTE — ED Provider Notes (Signed)
Alyssa [name] Provider Note   CSN: [id number] Arrival date & time: 11/27/20  2103     History Chief Complaint  Patient presents with   Chest Pain    [name] is a 60 y.o. 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  Patient notes that she was sitting at rest in her car when she noted some discomfort in her left chest, it has been moving throughout the left chest intermittently since onset, achy in nature, worse with twisting/turning motions, no alleviating factors.  No significant associated symptoms, she denies nausea, vomiting, diaphoresis, lightheadedness, dizziness, syncope, dyspnea, or acute leg pain/swelling.  She states that she did lift some heavy packages today which is very atypical for her.  No direct traumatic injury.  This does not feel like when she was diagnosed with her PE, it does somewhat feel like her prior costochondritis.  HPI     Past Medical History:  Diagnosis Date   Arthritis    Asthma    DVT (deep venous thrombosis) (HCC)    Hypertension    Pulmonary emboli [facility])     Patient Active Problem List   Diagnosis Date Noted   Lung nodule 11/04/2020   Pulmonary embolism ([place]) 11/03/2020   Pulmonary embolism on long-term anticoagulation therapy ([place]) 11/03/2020   Mild intermittent asthma without complication 73/71/0626   Anxiety 05/08/2018   Chronic cough 05/08/2018   Primary osteoarthritis of right knee 08/10/2017   Hyperlipidemia, mixed 08/09/2017   Iron deficiency anemia 08/09/2017   Obesity, morbid, BMI 40.0-49.9 ([place]) 08/09/2017   Sickle cell trait ([place]) 08/09/2017   Primary osteoarthritis of both knees 08/28/2014   Morbid obesity ([place]) 06/26/2014   Abnormal nuclear stress test 04/05/2013   Essential (primary)  hypertension 05/17/2010   Vitamin D deficiency 11/27/2008   Asthma 09/03/2008   Esophageal reflux 09/03/2008   Leiomyoma of uterus 09/03/2008   Chronic bronchitis ([place]) 03/27/2008    Past Surgical History:  Procedure Laterality Date   CESAREAN SECTION     CHOLECYSTECTOMY     KNEE ARTHROSCOPY W/ MENISCAL REPAIR Left    LUMBAR DISC ARTHROPLASTY       OB History   No obstetric history on file.     No family history on file.  Social History   Tobacco Use   Smoking status: Never   Smokeless tobacco: Never  Substance Use Topics   Alcohol use: Never   Drug use: Never    Home Medications Prior to Admission medications   Medication Sig Start Date End Date Taking? Authorizing Provider  acetaminophen (TYLENOL) 325 MG tablet Take 2 tablets (650 mg total) by mouth every 6 (six) hours as needed for mild pain or headache. 11/05/20   Oswald Hillock, MD  albuterol (VENTOLIN HFA) 108 (90 Base) MCG/ACT inhaler Inhale 2 puffs into the lungs every 6 (six) hours as needed for wheezing or shortness of breath. 10/16/20   [provider]  Azelastine HCl 137 MCG/SPRAY SOLN Place 1 spray into both nostrils 2 (two) times daily as needed (nasal congestion). Patient not taking: Reported on 11/10/2020 10/27/20   [provider]  lisinopril (ZESTRIL) 10 MG tablet Take 10 mg by mouth daily. 10/19/20   [provider]  metoprolol succinate (TOPROL-XL) 25 MG 24 hr tablet Take 25 mg by mouth daily. 10/16/20   [provider]  rivaroxaban (XARELTO) 20 MG TABS tablet Take 1 tablet (20 mg total) by mouth daily with supper. 11/22/20 02/20/21  Horald Pollen, MD  tiotropium (SPIRIVA HANDIHALER) 18 MCG inhalation capsule Place 1 capsule (18 mcg total) into inhaler and inhale daily. 11/20/20   Hoyt Koch, MD    Allergies    Other and Sulfa antibiotics  Review of Systems   Review of Systems  Constitutional:  Negative for chills, diaphoresis and fever.   Respiratory:  Negative for shortness of breath.   Cardiovascular:  Positive for chest pain. Negative for leg swelling.  Gastrointestinal:  Negative for abdominal pain, nausea and vomiting.  Neurological:  Negative for dizziness, syncope and light-headedness.  All other systems reviewed and are negative.  Physical Exam Updated Vital Signs BP 131/85   Pulse 65   Temp 98 F (36.7 C) (Oral)   Resp 19   SpO2 100%   Physical Exam Vitals and nursing note reviewed.  Constitutional:      General: She is not in acute distress.    Appearance: She is well-developed. She is not toxic-appearing.  HENT:     Head: Normocephalic and atraumatic.  Eyes:     General:        Right eye: No discharge.        Left eye: No discharge.     Conjunctiva/sclera: Conjunctivae normal.  Cardiovascular:     Rate and Rhythm: Normal rate and regular rhythm.     Pulses:          Radial pulses are 2+ on the right side and 2+ on the left side.       Dorsalis pedis pulses are 2+ on the right side and 2+ on the left side.  Pulmonary:     Effort: Pulmonary effort is normal. No respiratory distress.     Breath sounds: Normal breath sounds. No wheezing, rhonchi or rales.  Chest:     Chest wall: Tenderness (Left anterior chest wall which reproduces patient's pain.) present.  Abdominal:     General: There is no distension.     Palpations: Abdomen is soft.     Tenderness: There is no abdominal tenderness.  Musculoskeletal:     Cervical back: Neck supple.     Right lower leg: No tenderness.     Left lower leg: No tenderness.  Skin:    General: Skin is warm and dry.     Findings: No rash.  Neurological:     Mental Status: She is alert.     Comments: Clear speech.   Psychiatric:        Behavior: Behavior normal.    ED Results / Procedures / Treatments   Labs (all labs ordered are listed, but only abnormal results are displayed) Labs Reviewed  COMPREHENSIVE METABOLIC PANEL - Abnormal; Notable for the  following components:      Result Value   Albumin 3.3 (*)    AST 14 (*)    All other components within normal limits  CBC WITH DIFFERENTIAL/PLATELET - Abnormal; Notable for the following components:   Hemoglobin 11.8 (*)    MCH 25.4 (*)    All other components within normal limits  LIPASE, BLOOD  TROPONIN I (HIGH SENSITIVITY)  TROPONIN I (HIGH SENSITIVITY)    EKG None  Radiology DG Chest 2 View  Result Date: 11/27/2020 CLINICAL DATA:  Chest pain EXAM: CHEST - 2 VIEW COMPARISON:  CT angiography chest 11/03/2020, chest x-ray 11/03/2020 FINDINGS: The heart and mediastinal contours  are within normal limits. No focal consolidation. No pulmonary edema. No pleural effusion. No pneumothorax. No acute osseous abnormality. IMPRESSION: No active cardiopulmonary disease. Electronically Signed   By: Iven Finn M.D.   On: 11/27/2020 22:32    Procedures Procedures   Medications Ordered in ED Medications - No data to display  ED Course  I have reviewed the triage vital signs and the nursing notes.  Pertinent labs & imaging results that were available during my care of the patient were reviewed by me and considered in my medical decision making (see chart for details).    MDM Rules/Calculators/A&P                           Patient presents to the emergency [name] with chest pain. Patient nontoxic appearing, in no apparent distress, vitals with elevated BP- low suspicion for HTN emergency - improving in the ED. Exam with left chest wall tenderness to palpation.   DDX: ACS, pulmonary embolism, dissection, pneumothorax, pneumonia, arrhythmia, severe anemia, MSK, GERD, anxiety, abdominal process, pleurisy.   Additional history obtained:  Additional history obtained from chart review & nursing note review.   EKG: no STEMI.   Lab Tests:  I reviewed and interpreted labs, which included:  CBC: Mild anemia similar to prior.  CMP: mild hypoalbuminemia. No significant electrolyte  derangement.  Lipase: WNL Troponin: Flat, WNL  Imaging Studies ordered:  I ordered imaging studies which included CXR, I independently reviewed, formal radiology impression shows: No active cardiopulmonary disease ED Course:   Heart Pathway Score low risk- EKG without obvious acute ischemia, delta troponin negative, doubt ACS. Patient w/ hx of PE, currently on xarelto and has been compliant, she is not hypoxic or tachycardic, low suspicion for acute PE at this time.  Pain is not a tearing sensation, symmetric pulses, no widening of mediastinum on CXR, doubt dissection. Overall reassuring labs & imaging. Patient feeling improved following lidoderm patch and robaxin, given heavy lifting with reproducible chest wall tenderness to palpation possibly MSK pain, will tx supportively with PCP follow up. I discussed results, treatment plan, need for follow-up, and return precautions with the patient. Provided opportunity for questions, patient confirmed understanding and is in agreement with plan.    Portions of this note were generated with [employer]. Dictation errors may occur despite best attempts at proofreading.    Final Clinical Impression(s) / ED Diagnoses Final diagnoses:  Chest pain, unspecified type    Rx / DC Orders ED Discharge Orders          Ordered    methocarbamol (ROBAXIN) 500 MG tablet  2 times daily PRN        11/28/20 0505    lidocaine (LIDODERM) 5 %  Daily PRN        11/28/20 0505             Amaryllis Dyke, PA-C 11/28/20 0533    Orpah Greek, MD 11/28/20 [phone]

## 2020-11-30 NOTE — Telephone Encounter (Signed)
Patient calling in requesting to speak w/ office supervisor Alyssa [name] patient Alyssa [name] was not available at this time & would be back in office later this week  Patient is requesting a call back from Alyssa [name]  When I asked patient what call was in regards to patient stated "Alyssa [name] will know why I'm calling"  Call back # [phone]

## 2020-12-01 ENCOUNTER — Other Ambulatory Visit: Payer: Self-pay

## 2020-12-01 ENCOUNTER — Ambulatory Visit (INDEPENDENT_AMBULATORY_CARE_PROVIDER_SITE_OTHER): Payer: BC Managed Care – PPO | Admitting: Emergency Medicine

## 2020-12-01 ENCOUNTER — Encounter: Payer: Self-pay | Admitting: Emergency Medicine

## 2020-12-01 VITALS — BP 130/80 | HR 88 | Temp 98.3°F | Ht 66.0 in | Wt 277.4 lb

## 2020-12-01 DIAGNOSIS — I2699 Other pulmonary embolism without acute cor pulmonale: Secondary | ICD-10-CM | POA: Diagnosis not present

## 2020-12-01 DIAGNOSIS — R911 Solitary pulmonary nodule: Secondary | ICD-10-CM | POA: Diagnosis not present

## 2020-12-01 DIAGNOSIS — R053 Chronic cough: Secondary | ICD-10-CM

## 2020-12-01 DIAGNOSIS — J4520 Mild intermittent asthma, uncomplicated: Secondary | ICD-10-CM | POA: Diagnosis not present

## 2020-12-01 MED ORDER — VALSARTAN 80 MG PO TABS: 80.0000 mg | ORAL_TABLET | Freq: Every day | ORAL | 2 refills | Status: DC

## 2020-12-01 NOTE — Assessment & Plan Note (Signed)
Rounded left basilar 1 cm pulmonary nodule.  Etiology unclear.  Could be related to her PE, area of pulmonary infarct.  Its rounded nature would be more consistent with a possible carcinoid tumor.  It will need to be followed for stability.  She is low risk patient so we can follow in 6 months.  There may be remote CT scan data that we can review from [place] when she had a cholecystectomy in 1999.  We will try to obtain these films.

## 2020-12-01 NOTE — Progress Notes (Signed)
Subjective:    Patient ID: Alyssa [name], female    DOB: 01-Feb-1961, 60 y.o.   MRN: [id number]  HPI 60 year old woman, never smoker with a history of arthritis, hypertension, asthma that was diagnosed clinically in her 39's.  I do not see any PFT.  She was evaluated beginning of 10/2020 for acute exertional dyspnea, found to have multiple bilateral PE and a right lower extremity DVT.  Started on anticoagulation.  She was also started on Incruse during that hospitalization for dyspnea (changed to Spiriva), possible component of obstructive lung disease. At baseline she uses rare albuterol during the allergy seasons. Usually showed itself by cough.  She reports today that her exertional SOB is better but not back to baseline. She was having trouble even getting through the house without SOB before she was admitted. Her cough is a bit better on the Spiriva.   She had a chole in 1999, may have had a CT abdomen at [facility]    CT-PA 11/03/2020 reviewed by me, shows multiple bilateral pulmonary emboli without any evidence of right heart strain.  There was a round 12 mm left posterior costophrenic angle nodule without effusion   Review of Systems As per HPi  Past Medical History:  Diagnosis Date   Arthritis    Asthma    DVT (deep venous thrombosis) (HCC)    Hypertension    Pulmonary emboli (HCC)      No family history on file.     Social History   Socioeconomic History   Marital status: Single    Spouse name: Not on file   Number of children: Not on file   Years of education: Not on file   Highest education level: Not on file  Occupational History   Not on file  Tobacco Use   Smoking status: Never   Smokeless tobacco: Never  Substance and Sexual Activity   Alcohol use: Never   Drug use: Never   Sexual activity: Not on file  Other Topics Concern   Not on file  Social History Narrative   Not on file   Social Determinants of Health   Financial Resource  Strain: Not on file  Food Insecurity: Not on file  Transportation Needs: Not on file  Physical Activity: Not on file  Stress: Not on file  Social Connections: Not on file  Intimate Partner Violence: Not on file    From [place], has been in [place] for 16 yrs Has worked as a Network engineer.   Allergies  Allergen Reactions   Other Hives   Sulfa Antibiotics Hives   Lactose Intolerance (Gi)      Outpatient Medications Prior to Visit  Medication Sig Dispense Refill   acetaminophen (TYLENOL) 325 MG tablet Take 2 tablets (650 mg total) by mouth every 6 (six) hours as needed for mild pain or headache.     albuterol (VENTOLIN HFA) 108 (90 Base) MCG/ACT inhaler Inhale 2 puffs into the lungs every 6 (six) hours as needed for wheezing or shortness of breath.     Azelastine HCl 137 MCG/SPRAY SOLN Place 1 spray into both nostrils 2 (two) times daily as needed (nasal congestion).     lidocaine (LIDODERM) 5 % Place 1 patch onto the skin daily as needed. Apply patch to area most significant pain once per day.  Remove and discard patch within 12 hours of application. 30 patch 0   metoprolol succinate (TOPROL-XL) 25 MG 24 hr tablet Take 25 mg by mouth daily.  rivaroxaban (XARELTO) 20 MG TABS tablet Take 1 tablet (20 mg total) by mouth daily with supper. 90 tablet 2   tiotropium (SPIRIVA HANDIHALER) 18 MCG inhalation capsule Place 1 capsule (18 mcg total) into inhaler and inhale daily. 30 capsule 12   lisinopril (ZESTRIL) 10 MG tablet Take 10 mg by mouth daily.     methocarbamol (ROBAXIN) 500 MG tablet Take 1 tablet (500 mg total) by mouth 2 (two) times daily as needed for muscle spasms. (Patient not taking: Reported on 12/01/2020) 15 tablet 0   No facility-administered medications prior to visit.         Objective:   Physical Exam Vitals:   12/01/20 0927  BP: 130/80  Pulse: 88  Temp: 98.3 F (36.8 C)  TempSrc: Oral  SpO2: 98%  Weight: 277 lb 6.4 oz (125.8 kg)  Height: 5\' 6"  (1.676 m)   Gen:  Pleasant, obese woman, in no distress,  normal affect  ENT: No lesions,  mouth clear,  oropharynx clear, no postnasal drip  Neck: No JVD, no stridor  Lungs: No use of accessory muscles, decreased at both bases, no crackles or wheezing on normal respiration, no wheeze on forced expiration  Cardiovascular: RRR, heart sounds normal, no murmur or gallops, no peripheral edema  Musculoskeletal: No deformities, no cyanosis or clubbing  Neuro: alert, awake, non focal  Skin: Warm, no lesions or rash      Assessment & Plan:   Asthma She has had dyspnea and cough since her late 61s particularly during the spring and fall allergy seasons, has needed to use albuterol at those times.  Unsure whether she is ever had any formal pulmonary function testing.  She may have gotten a clinical benefit with initiation of LAMA on her recent hospitalization.  She very well may benefit from a change to ICS/LABA.  Would like for her to have formal pulmonary function testing to quantify her degree of obstruction.  We will arrange.  Plan to continue the Spiriva for now, albuterol as needed  Chronic cough Question whether some of this may be true obstruction, asthma with cough predominance.  Also of note she is on lisinopril.  Will attempt to address any contributors to upper airway disease (including may need to change BD to nonpowdered formulation).  We will start with changing her ACE inhibitor to ARB today.  Lung nodule Rounded left basilar 1 cm pulmonary nodule.  Etiology unclear.  Could be related to her PE, area of pulmonary infarct.  Its rounded nature would be more consistent with a possible carcinoid tumor.  It will need to be followed for stability.  She is low risk patient so we can follow in 6 months.  There may be remote CT scan data that we can review from [place] when she had a cholecystectomy in 1999.  We will try to obtain these films.  Pulmonary embolism (HCC) Probable provoked pulmonary embolism  with right lower extremity DVT noted in the setting of immobility due to severe osteoarthritis.  Currently on DOAC.  Will need to continue this for at least 6 months.  At that time we can consider possibly discontinuing although if she is going to remain immobile then she may need longer term therapy.   Baltazar Apo, MD, PhD 12/01/2020, 12:17 PM [facility] [phone] or if no answer before 7:00PM call [phone] For any issues after 7:00PM please call eLink [phone]

## 2020-12-01 NOTE — Assessment & Plan Note (Signed)
Question whether some of this may be true obstruction, asthma with cough predominance.  Also of note she is on lisinopril.  Will attempt to address any contributors to upper airway disease (including may need to change BD to nonpowdered formulation).  We will start with changing her ACE inhibitor to ARB today.

## 2020-12-01 NOTE — Assessment & Plan Note (Signed)
Probable provoked pulmonary embolism with right lower extremity DVT noted in the setting of immobility due to severe osteoarthritis.  Currently on DOAC.  Will need to continue this for at least 6 months.  At that time we can consider possibly discontinuing although if she is going to remain immobile then she may need longer term therapy.

## 2020-12-01 NOTE — Patient Instructions (Addendum)
Please continue Xarelto as you have been taking it.  We will plan to stay on this for at least 6 months We will plan to repeat your CT scan of the chest without contrast in April 2022. We will try to get your old CT scan of the abdomen from [place] to compare with your current scans Stop lisinopril We will start valsartan 80 mg once daily We will perform pulmonary function testing in next office visit Continue Spiriva for now.  We may decide to adjust this medication going forward. Keep albuterol available to use 2 puffs when you needed for shortness of breath, chest tightness, wheezing. Use Astelin nasal spray 2 sprays each nostril up to 2 times daily if you need it for congestion. Follow with Dr. Lamonte Sakai next available with full pulmonary function testing on the same day.

## 2020-12-01 NOTE — Assessment & Plan Note (Signed)
She has had dyspnea and cough since her late 52s particularly during the spring and fall allergy seasons, has needed to use albuterol at those times.  Unsure whether she is ever had any formal pulmonary function testing.  She may have gotten a clinical benefit with initiation of LAMA on her recent hospitalization.  She very well may benefit from a change to ICS/LABA.  Would like for her to have formal pulmonary function testing to quantify her degree of obstruction.  We will arrange.  Plan to continue the Spiriva for now, albuterol as needed

## 2020-12-03 DIAGNOSIS — M1990 Unspecified osteoarthritis, unspecified site: Secondary | ICD-10-CM | POA: Insufficient documentation

## 2020-12-03 DIAGNOSIS — I82409 Acute embolism and thrombosis of unspecified deep veins of unspecified lower extremity: Secondary | ICD-10-CM | POA: Insufficient documentation

## 2020-12-04 ENCOUNTER — Other Ambulatory Visit: Payer: Self-pay

## 2020-12-04 ENCOUNTER — Ambulatory Visit (INDEPENDENT_AMBULATORY_CARE_PROVIDER_SITE_OTHER): Payer: BC Managed Care – PPO | Admitting: Cardiology

## 2020-12-04 ENCOUNTER — Encounter: Payer: Self-pay | Admitting: Cardiology

## 2020-12-04 VITALS — BP 126/82 | HR 74 | Ht 66.0 in | Wt 275.0 lb

## 2020-12-04 DIAGNOSIS — E782 Mixed hyperlipidemia: Secondary | ICD-10-CM

## 2020-12-04 DIAGNOSIS — Z86711 Personal history of pulmonary embolism: Secondary | ICD-10-CM

## 2020-12-04 DIAGNOSIS — Z86718 Personal history of other venous thrombosis and embolism: Secondary | ICD-10-CM | POA: Insufficient documentation

## 2020-12-04 DIAGNOSIS — R739 Hyperglycemia, unspecified: Secondary | ICD-10-CM

## 2020-12-04 DIAGNOSIS — I10 Essential (primary) hypertension: Secondary | ICD-10-CM

## 2020-12-04 HISTORY — DX: Personal history of other venous thrombosis and embolism: Z86.718

## 2020-12-04 HISTORY — DX: Personal history of pulmonary embolism: Z86.711

## 2020-12-04 NOTE — Patient Instructions (Signed)
Medication Instructions:  Your physician recommends that you continue on your current medications as directed. Please refer to the Current Medication list given to you today.  *If you need a refill on your cardiac medications before your next appointment, please call your pharmacy*   Lab Work: None If you have labs (blood work) drawn today and your tests are completely normal, you will receive your results only by: [place] (if you have MyChart) OR A paper copy in the mail If you have any lab test that is abnormal or we need to change your treatment, we will call you to review the results.   Testing/Procedures: Your physician has requested that you have an echocardiogram in 2 month. Echocardiography is a painless test that uses sound waves to create images of your heart. It provides your doctor with information about the size and shape of your heart and how well your heart's chambers and valves are working. This procedure takes approximately one hour. There are no restrictions for this procedure.    Follow-Up: At [facility], you and your health needs are our priority.  As part of our continuing mission to provide you with exceptional heart care, we have created designated Provider Care Teams.  These Care Teams include your primary Cardiologist (physician) and Advanced Practice Providers (APPs -  Physician Assistants and Nurse Practitioners) who all work together to provide you with the care you need, when you need it.  We recommend signing up for the patient portal called "MyChart".  Sign up information is provided on this After Visit Summary.  MyChart is used to connect with patients for Virtual Visits (Telemedicine).  Patients are able to view lab/test results, encounter notes, upcoming appointments, etc.  Non-urgent messages can be sent to your provider as well.   To learn more about what you can do with MyChart, go to [email or web address].    Your next appointment:   2  month(s)  The format for your next appointment:   In Person  Provider:   Jenne Campus, MD    Other Instructions

## 2020-12-04 NOTE — Progress Notes (Signed)
Cardiology Consultation:    Date:  12/04/2020   ID:  Alyssa [name], DOB 07/10/1960, MRN [id number]  PCP:  Alyssa Pollen, MD  Cardiologist:  Alyssa Campus, MD   Referring MD: Alyssa [name], *   Chief Complaint  Patient presents with   Chest Pain    History of Present Illness:    Alyssa [name] is a 60 y.o. female who is being seen today for the evaluation of chest pain at the request of Alyssa [name], *.  Past medical history significant for essential hypertension, dyslipidemia, borderline diabetes.  She was referred to [place] because of chest pain.  In 2015 she had a cardiac catheterization for atypical chest pain.  Cardiac catheterization showed normal coronaries.  She has diagnosis of costochondritis and some of her symptomatology is fairly typical for it.  Pressing chest wall will create some pain.  About a month ago she ended going to the hospital because of shortness of breath, she was find to have DVT as well as PE.  Very appropriately she was anticoagulated.  Also couple weeks ago she ended up back in the hospital emergency room because of chest pain.  Pain was atypical she ruled out for myocardial infarction she was asked to follow-up with [place].  Since that time she is doing well.  She became more active and she decided to walk on the regular basis.  She is somewhat short of breath she walks relatively slowly she does 1 mile within 35 minutes.  But gradually build up stamina.  She does not have exertional chest pain tightness squeezing pressure burning chest, She never smoked She does not have family history of premature coronary artery disease and CHF however her mother did have congestive heart failure but after age of 51, Did not exercise on the regular basis but now started doing it. Does have arthritis in both knee that saw her down.  She received steroid injection to those knees. Past Medical History:  Diagnosis Date   Arthritis    Asthma     Costochondritis    DVT (deep venous thrombosis) (HCC)    Hypertension    Pulmonary emboli (HCC)     Past Surgical History:  Procedure Laterality Date   CESAREAN SECTION     CHOLECYSTECTOMY     KNEE ARTHROSCOPY W/ MENISCAL REPAIR Left    LUMBAR DISC ARTHROPLASTY      Current Medications: Current Meds  Medication Sig   acetaminophen (TYLENOL) 325 MG tablet Take 2 tablets (650 mg total) by mouth every 6 (six) hours as needed for mild pain or headache.   albuterol (VENTOLIN HFA) 108 (90 Base) MCG/ACT inhaler Inhale 2 puffs into the lungs every 6 (six) hours as needed for wheezing or shortness of breath.   Azelastine HCl 137 MCG/SPRAY SOLN Place 1 spray into both nostrils 2 (two) times daily as needed (nasal congestion).   metoprolol succinate (TOPROL-XL) 25 MG 24 hr tablet Take 25 mg by mouth daily.   rivaroxaban (XARELTO) 20 MG TABS tablet Take 1 tablet (20 mg total) by mouth daily with supper.   tiotropium (SPIRIVA HANDIHALER) 18 MCG inhalation capsule Place 1 capsule (18 mcg total) into inhaler and inhale daily.   valsartan (DIOVAN) 80 MG tablet Take 1 tablet (80 mg total) by mouth daily.     Allergies:   Sulfa antibiotics and Lactose intolerance (gi)   Social History   Socioeconomic History   Marital status: Single    Spouse name: Not on file  Number of children: Not on file   Years of education: Not on file   Highest education level: Not on file  Occupational History   Not on file  Tobacco Use   Smoking status: Never   Smokeless tobacco: Never  Substance and Sexual Activity   Alcohol use: Never   Drug use: Never   Sexual activity: Not on file  Other Topics Concern   Not on file  Social History Narrative   Not on file   Social Determinants of Health   Financial Resource Strain: Not on file  Food Insecurity: Not on file  Transportation Needs: Not on file  Physical Activity: Not on file  Stress: Not on file  Social Connections: Not on file     Family  History: The patient's family history includes Congestive Heart Failure in her mother; Heart disease in her brother; Stroke in her mother. ROS:   Please see the history of present illness.    All 14 point review of systems negative except as described per history of present illness.  EKGs/Labs/Other Studies Reviewed:    The following studies were reviewed today: Record from the emergency room reviewed  EKG:  EKG is  ordered today.  The ekg ordered today demonstrates normal sinus rhythm normal P interval low voltage QRS complex no ST segment changes.  Recent Labs: 11/03/2020: B Natriuretic Peptide 19.8 11/27/2020: ALT 11; BUN 17; Creatinine, Ser 0.97; Hemoglobin 11.8; Platelets 277; Potassium 3.9; Sodium 138  Recent Lipid Panel No results found for: CHOL, TRIG, HDL, CHOLHDL, VLDL, LDLCALC, LDLDIRECT  Physical Exam:    VS:  BP 126/82 (BP Location: Right Arm, Patient Position: Sitting, Cuff Size: Large)   Pulse 74   Ht 5\' 6"  (1.676 m)   Wt 275 lb (124.7 kg)   SpO2 98%   BMI 44.39 kg/m     Wt Readings from Last 3 Encounters:  12/04/20 275 lb (124.7 kg)  12/01/20 277 lb 6.4 oz (125.8 kg)  11/10/20 272 lb (123.4 kg)     GEN:  Well nourished, well developed in no acute distress HEENT: Normal NECK: No JVD; No carotid bruits LYMPHATICS: No lymphadenopathy CARDIAC: RRR, no murmurs, no rubs, no gallops RESPIRATORY:  Clear to auscultation without rales, wheezing or rhonchi  ABDOMEN: Soft, non-tender, non-distended MUSCULOSKELETAL:  No edema; No deformity  SKIN: Warm and dry NEUROLOGIC:  Alert and oriented x 3 PSYCHIATRIC:  Normal affect   ASSESSMENT:    1. Hyperlipidemia, mixed   2. History of pulmonary embolism   3. History of DVT (deep vein thrombosis)   4. Elevated blood sugar   5. Primary hypertension    PLAN:    In order of problems listed above:  Atypical chest pain.  Look like it some musculoskeletal most likely costochondritis that she had for years.  In 2015 she  had cardiac catheterization done which showed no obstructive disease basically was normal.  Now his symptomatology is fairly typical for costochondritis. History of PE and DVT.  I think we can consider this as provoked.  She tells me that she used to sit for hours in front of computer in the morning without getting up much.  Even when she have to move somewhere she typically while in her chair she does have arthritis with prevent her from doing more walking but now she is gradually building up her stamina.  The plan will be in about 2 months we will repeat echocardiogram to look at the right ventricle size and function. Elevated  sugar is being followed by antimedicine team. Dyslipidemia I do not have recent data we will try to get a copy of her report if nothing recently we will do the test. I do not take she required any CAD work-up at this stage.  We did talk in length about healthy lifestyle need to exercise on the regular basis which she is already started doing   Medication Adjustments/Labs and Tests Ordered: Current medicines are reviewed at length with the patient today.  Concerns regarding medicines are outlined above.  No orders of the defined types were placed in this encounter.  No orders of the defined types were placed in this encounter.   Signed, Park Liter, MD, [facility]. 12/04/2020 9:13 AM    [facility]

## 2020-12-18 ENCOUNTER — Encounter: Payer: Self-pay | Admitting: Cardiology

## 2020-12-28 ENCOUNTER — Emergency Department (HOSPITAL_BASED_OUTPATIENT_CLINIC_OR_DEPARTMENT_OTHER): Payer: BC Managed Care – PPO

## 2020-12-28 ENCOUNTER — Encounter (HOSPITAL_BASED_OUTPATIENT_CLINIC_OR_DEPARTMENT_OTHER): Payer: Self-pay

## 2020-12-28 ENCOUNTER — Emergency Department (HOSPITAL_BASED_OUTPATIENT_CLINIC_OR_DEPARTMENT_OTHER)
Admission: EM | Admit: 2020-12-28 | Discharge: 2020-12-28 | Disposition: A | Discharge status: Home / Self Care | Payer: BC Managed Care – PPO | Attending: Emergency Medicine | Admitting: Emergency Medicine

## 2020-12-28 ENCOUNTER — Telehealth (INDEPENDENT_AMBULATORY_CARE_PROVIDER_SITE_OTHER): Payer: BC Managed Care – PPO | Admitting: Adult Health

## 2020-12-28 ENCOUNTER — Encounter: Payer: Self-pay | Admitting: Emergency Medicine

## 2020-12-28 ENCOUNTER — Other Ambulatory Visit: Payer: Self-pay

## 2020-12-28 DIAGNOSIS — J101 Influenza due to other identified influenza virus with other respiratory manifestations: Secondary | ICD-10-CM | POA: Diagnosis not present

## 2020-12-28 DIAGNOSIS — R918 Other nonspecific abnormal finding of lung field: Secondary | ICD-10-CM | POA: Diagnosis not present

## 2020-12-28 DIAGNOSIS — R000 Tachycardia, unspecified: Secondary | ICD-10-CM | POA: Diagnosis not present

## 2020-12-28 DIAGNOSIS — J441 Chronic obstructive pulmonary disease with (acute) exacerbation: Secondary | ICD-10-CM

## 2020-12-28 DIAGNOSIS — Z7901 Long term (current) use of anticoagulants: Secondary | ICD-10-CM | POA: Insufficient documentation

## 2020-12-28 DIAGNOSIS — R0602 Shortness of breath: Secondary | ICD-10-CM | POA: Diagnosis not present

## 2020-12-28 DIAGNOSIS — Z20822 Contact with and (suspected) exposure to covid-19: Secondary | ICD-10-CM | POA: Diagnosis not present

## 2020-12-28 DIAGNOSIS — R6889 Other general symptoms and signs: Secondary | ICD-10-CM | POA: Diagnosis not present

## 2020-12-28 DIAGNOSIS — I10 Essential (primary) hypertension: Secondary | ICD-10-CM | POA: Diagnosis not present

## 2020-12-28 DIAGNOSIS — J4520 Mild intermittent asthma, uncomplicated: Secondary | ICD-10-CM | POA: Diagnosis not present

## 2020-12-28 DIAGNOSIS — R059 Cough, unspecified: Secondary | ICD-10-CM | POA: Diagnosis not present

## 2020-12-28 LAB — BASIC METABOLIC PANEL
Anion gap: 10 (ref 5–15)
BUN: 12 mg/dL (ref 8–23)
CO2: 23 mmol/L (ref 22–32)
Calcium: 8.9 mg/dL (ref 8.9–10.3)
Chloride: 104 mmol/L (ref 98–111)
Creatinine, Ser: 1.1 mg/dL — ABNORMAL HIGH (ref 0.44–1.00)
GFR, Estimated: 57 mL/min — ABNORMAL LOW (ref >60)
Glucose, Bld: 139 mg/dL — ABNORMAL HIGH (ref 70–99)
Potassium: 3.6 mmol/L (ref 3.5–5.1)
Sodium: 137 mmol/L (ref 135–145)

## 2020-12-28 LAB — CBC
HCT: 39.4 % (ref 36.0–46.0)
Hemoglobin: 12.8 g/dL (ref 12.0–15.0)
MCH: 25.4 pg — ABNORMAL LOW (ref 26.0–34.0)
MCHC: 32.5 g/dL (ref 30.0–36.0)
MCV: 78.2 fL — ABNORMAL LOW (ref 80.0–100.0)
Platelets: 202 10*3/uL (ref 150–400)
RBC: 5.04 MIL/uL (ref 3.87–5.11)
RDW: 15.5 % (ref 11.5–15.5)
WBC: 11.5 10*3/uL — ABNORMAL HIGH (ref 4.0–10.5)
nRBC: 0 % (ref 0.0–0.2)

## 2020-12-28 LAB — TROPONIN I (HIGH SENSITIVITY): Troponin I (High Sensitivity): 8 ng/L (ref <18)

## 2020-12-28 LAB — RESP PANEL BY RT-PCR (FLU A&B, COVID) ARPGX2
Influenza A by PCR: POSITIVE — AB
Influenza B by PCR: NEGATIVE
SARS Coronavirus 2 by RT PCR: NEGATIVE

## 2020-12-28 MED ORDER — XOFLUZA (40 MG DOSE) 1 X 40 MG PO TBPK: 80.0000 mg | ORAL_TABLET | Freq: Once | ORAL | 0 refills | Status: AC

## 2020-12-28 MED ORDER — PREDNISONE 20 MG PO TABS: 20.0000 mg | ORAL_TABLET | Freq: Every day | ORAL | 0 refills | Status: DC

## 2020-12-28 MED ORDER — AZITHROMYCIN 250 MG PO TABS: ORAL_TABLET | ORAL | 0 refills | Status: AC

## 2020-12-28 MED ORDER — OSELTAMIVIR PHOSPHATE 75 MG PO CAPS: 75.0000 mg | ORAL_CAPSULE | Freq: Two times a day (BID) | ORAL | 0 refills | Status: DC

## 2020-12-28 MED ORDER — RIVAROXABAN 20 MG PO TABS
20.0000 mg | ORAL_TABLET | Freq: Once | ORAL | Status: AC
  Administered 2020-12-28: 19:00:00 20 mg via ORAL
  Filled 2020-12-28: qty 1

## 2020-12-28 NOTE — ED Triage Notes (Signed)
Patient states she has been shortness of breath x 2 days - Patient states she has bilateral Pe's so she was instructed to be seen here. Patient is worried she has the flu - Patient is speaking in full sentences. Vss.

## 2020-12-28 NOTE — ED Provider Notes (Signed)
Emergency Department Provider Note   I have reviewed the triage vital signs and the nursing notes.   HISTORY  Chief Complaint Shortness of Breath   HPI Alyssa [name] is a 60 y.o. female with prior PE history presents to the ED with SOB for the last 2 days. No CP. Has developed flu-like symptoms for the last several days. She is compliant with her home medications including anticoagulation. No abdominal pain or GI symptoms. No leg pain. No radiation of symptoms or mother modifying factors.   Past Medical History:  Diagnosis Date   Arthritis    Asthma    Costochondritis    DVT (deep venous thrombosis) (HCC)    Hypertension    Pulmonary emboli [facility])     Patient Active Problem List   Diagnosis Date Noted   History of pulmonary embolism 12/04/2020   History of DVT (deep vein thrombosis) 12/04/2020   DVT (deep venous thrombosis) ([place]) 12/03/2020   Arthritis 12/03/2020   Lung nodule 11/04/2020   Pulmonary embolism ([place]) 11/03/2020   Pulmonary embolism on Sebastian Lurz-term anticoagulation therapy ([place]) 11/03/2020   Mild intermittent asthma without complication 78/29/5621   Anxiety 05/08/2018   Primary osteoarthritis of right knee 08/10/2017   Hyperlipidemia, mixed 08/09/2017   Iron deficiency anemia 08/09/2017   Obesity, morbid, BMI 40.0-49.9 ([place]) 08/09/2017   Sickle cell trait ([place]) 08/09/2017   Elevated blood sugar 10/19/2015   Primary osteoarthritis of both knees 08/28/2014   Morbid obesity ([place]) 06/26/2014   Abnormal nuclear stress test 04/05/2013   Primary hypertension 05/17/2010   Vitamin D deficiency 11/27/2008   Asthma 09/03/2008   Esophageal reflux 09/03/2008   Leiomyoma of uterus 09/03/2008   Chronic bronchitis ([place]) 03/27/2008    Past Surgical History:  Procedure Laterality Date   CESAREAN SECTION     CHOLECYSTECTOMY     KNEE ARTHROSCOPY W/ MENISCAL REPAIR Left    LUMBAR DISC ARTHROPLASTY      Allergies Sulfa antibiotics and Lactose intolerance  (gi)  Family History  Problem Relation Age of Onset   Congestive Heart Failure Mother    Stroke Mother    Heart disease Brother        Pacemaker    Social History Social History   Tobacco Use   Smoking status: Never   Smokeless tobacco: Never  Substance Use Topics   Alcohol use: Never   Drug use: Never    Review of Systems  Constitutional: No fever/chills. Positive body aches.  Eyes: No visual changes. ENT: No sore throat. Positive congestion.  Cardiovascular: Denies chest pain. Respiratory: Mild shortness of breath with cough.  Gastrointestinal: No abdominal pain.  No nausea, no vomiting.  No diarrhea.  No constipation. Genitourinary: Negative for dysuria. Musculoskeletal: Negative for back pain. Skin: Negative for rash. Neurological: Negative for headaches, focal weakness or numbness.  10-point ROS otherwise negative.  ____________________________________________   PHYSICAL EXAM:  VITAL SIGNS: ED Triage Vitals  Enc Vitals Group     BP 12/28/20 1716 (!) 147/82     Pulse Rate 12/28/20 1716 (!) 127     Resp 12/28/20 1716 18     Temp 12/28/20 1716 98.7 F (37.1 C)     Temp Source 12/28/20 1716 Oral     SpO2 12/28/20 1716 95 %     Weight 12/28/20 1717 272 lb (123.4 kg)     Height 12/28/20 1717 5\' 6"  (1.676 m)    Constitutional: Alert and oriented. Well appearing and in no acute distress. Eyes: Conjunctivae are normal.  Head: Atraumatic. Nose: Positive congestion/rhinnorhea. Mouth/Throat: Mucous membranes are moist.   Neck: No stridor.   Cardiovascular: Tachycardia. Good peripheral circulation. Grossly normal heart sounds.   Respiratory: Normal respiratory effort.  No retractions. Lungs CTAB. Gastrointestinal: Soft and nontender. No distention.  Musculoskeletal: No lower extremity tenderness nor edema. No gross deformities of extremities. Neurologic:  Normal speech and language. No gross focal neurologic deficits are appreciated.  Skin:  Skin is warm, dry  and intact. No rash noted.   ____________________________________________   LABS (all labs ordered are listed, but only abnormal results are displayed)  Labs Reviewed  RESP PANEL BY RT-PCR (FLU A&B, COVID) ARPGX2 - Abnormal; Notable for the following components:      Result Value   Influenza A by PCR POSITIVE (*)    All other components within normal limits  BASIC METABOLIC PANEL - Abnormal; Notable for the following components:   Glucose, Bld 139 (*)    Creatinine, Ser 1.10 (*)    GFR, Estimated 57 (*)    All other components within normal limits  CBC - Abnormal; Notable for the following components:   WBC 11.5 (*)    MCV 78.2 (*)    MCH 25.4 (*)    All other components within normal limits  TROPONIN I (HIGH SENSITIVITY)   ____________________________________________  EKG   EKG Interpretation  Date/Time:  Monday December 28 2020 17:20:32 EST Ventricular Rate:  126 PR Interval:  138 QRS Duration: 68 QT Interval:  292 QTC Calculation: 422 R Axis:   118 Text Interpretation: Sinus tachycardia with Premature supraventricular complexes Right axis deviation Low voltage QRS Abnormal ECG Confirmed by Nanda Quinton [phone]) on 12/28/2020 5:25:12 PM        ____________________________________________  RADIOLOGY  DG Chest 2 View  Result Date: 12/28/2020 CLINICAL DATA:  Cough EXAM: CHEST - 2 VIEW COMPARISON:  11/27/2020 FINDINGS: Patchy opacity within the left upper lung and left lung base. No pleural effusion. Normal cardiomediastinal silhouette. No pneumothorax. IMPRESSION: Patchy opacity in the left upper lung and left base suspicious for pneumonia. Radiographic follow-up to resolution is recommended Electronically Signed   By: Donavan Foil M.D.   On: 12/28/2020 18:05    ____________________________________________   PROCEDURES  Procedure(s) performed:   Procedures  None ____________________________________________   INITIAL IMPRESSION / ASSESSMENT AND PLAN /  ED COURSE  Pertinent labs & imaging results that were available during my care of the patient were reviewed by me and considered in my medical decision making (see chart for details).   Patient presents to the ED with flu-like symptoms. Has been diagnosed with PEs in the recent past and is compliant with medications. Tachycardia noted but no hypoxemia, increased WOB on exam, leg swelling. Not consistent with ACS or CHF. Troponin is negative. Flu A positive on PCR. Very low suspicion for increased PE burden with known clot and compliance with anticoagulation. Flu fits better with patient's symptoms and presentation. Discussed supportive care, PCP follow up, and ED return precautions.    ____________________________________________  FINAL CLINICAL IMPRESSION(S) / ED DIAGNOSES  Final diagnoses:  Influenza A     MEDICATIONS GIVEN DURING THIS VISIT:  Medications  rivaroxaban (XARELTO) tablet 20 mg (20 mg Oral Given 12/28/20 1856)     NEW OUTPATIENT MEDICATIONS STARTED DURING THIS VISIT:  Discharge Medication List as of 12/28/2020  7:14 PM     START taking these medications   Details  Baloxavir Marboxil,40 MG Dose, (XOFLUZA, 40 MG DOSE,) 1 x 40 MG TBPK Take 80  mg by mouth once for 1 dose., Starting Mon 12/28/2020, Print    oseltamivir (TAMIFLU) 75 MG capsule Take 1 capsule (75 mg total) by mouth every 12 (twelve) hours., Starting Mon 12/28/2020, Print        Note:  This document was prepared using Dragon voice recognition software and may include unintentional dictation errors.  Nanda Quinton, MD, [facility] Emergency Medicine    Fairy Ashlock, Wonda Olds, MD 01/03/21 2038

## 2020-12-28 NOTE — Telephone Encounter (Signed)
I spoke with the pt and scheduled her for a video visit with TP at 3:30 pm today

## 2020-12-28 NOTE — Patient Instructions (Addendum)
Recommend you go to ER for evaluation for possible pneumonia and low oxygen levels.  Z-Pack take as directed .  Mucinex DM Twice daily  As needed  cough/congestion  Fluids and rest  Tylenol As needed   Activity as tolerated.  Prednisone 20mg  daily for 5 days  Follow up in office in 1-2 weeks with Dr. Lamonte Sakai  and As needed   Please contact office for sooner follow up if symptoms do not improve or worsen or seek emergency care

## 2020-12-28 NOTE — Discharge Instructions (Signed)
You were diagnosed with the flu (influenza).  You will feel ill for as much as a few weeks.  Please take any prescribed medications as instructed, and you may use over-the-counter Tylenol as needed according to label instructions (unless you have an allergy to either or have been told by your doctor not to take them).  Follow up with your physician as instructed above, and return to the Emergency Department (ED) if you are unable to tolerate fluids due to vomiting, have worsening trouble breathing, become extremely tired or difficult to awaken, or if you develop any other symptoms that concern you.  

## 2020-12-28 NOTE — Progress Notes (Signed)
Virtual Visit via Video Note  I connected with Alyssa [name] on 12/28/20 at  3:30 PM EST by a video enabled telemedicine application and verified that I am speaking with the correct person using two identifiers.  Location: Patient: Home   Provider: Office    I discussed the limitations of evaluation and management by telemedicine and the availability of in person appointments. The patient expressed understanding and agreed to proceed.  History of Present Illness: 60 yo female never smoker seen for pulmonary consult December 01, 2020 for chronic cough and shortness of breath.   Medical history significant for PE and DVT  Today's video visit is for an acute office visit.  Patient complains over the last 5 days that she has had cough, congestion, hoarseness, fever up to 102.9, oxygen levels running at 88 to 91% on room air.  She is not on oxygen.  Patient has had body aches decreased appetite general malaise and fatigue.  She has not been vaccinated for the Flu. Covid vaccine booster (#4 ) in September 2022 .  Daughter is sick with similar symptoms  She has been using over-the-counter cold medicines with minimum relief.  Patient says she feels very bad. She denies any hemoptysis, chest pain, orthopnea.  Past Medical History:  Diagnosis Date   Arthritis    Asthma    Costochondritis    DVT (deep venous thrombosis) (HCC)    Hypertension    Pulmonary emboli (HCC)    Current Outpatient Medications on File Prior to Visit  Medication Sig Dispense Refill   acetaminophen (TYLENOL) 325 MG tablet Take 2 tablets (650 mg total) by mouth every 6 (six) hours as needed for mild pain or headache.     albuterol (VENTOLIN HFA) 108 (90 Base) MCG/ACT inhaler Inhale 2 puffs into the lungs every 6 (six) hours as needed for wheezing or shortness of breath.     Azelastine HCl 137 MCG/SPRAY SOLN Place 1 spray into both nostrils 2 (two) times daily as needed (nasal congestion).     metoprolol succinate  (TOPROL-XL) 25 MG 24 hr tablet Take 25 mg by mouth daily.     rivaroxaban (XARELTO) 20 MG TABS tablet Take 1 tablet (20 mg total) by mouth daily with supper. 90 tablet 2   tiotropium (SPIRIVA HANDIHALER) 18 MCG inhalation capsule Place 1 capsule (18 mcg total) into inhaler and inhale daily. 30 capsule 12   valsartan (DIOVAN) 80 MG tablet Take 1 tablet (80 mg total) by mouth daily. 30 tablet 2   No current facility-administered medications on file prior to visit.    Observations/Objective: Home O2 sats 88-91%  Appears ill on video lying in bed. Speaks in full sentences.  Assessment and Plan: Flulike illness.  Patient has had 5 days of symptoms with fever body aches and cold-like symptoms.  Would recommend flu and COVID-19 testing. Unfortunately patient is having low oxygen levels concerned that she may be developing a secondary superimposed pneumonia.  Recommend that she seek emergency room care for further evaluation and imaging.  May need hospitalization I have sent in an antibiotic and short course of prednisone in case patient does not go to the emergency room.  Of advised her of symptoms to report if she develops hemoptysis, decreased oxygen levels, orthopnea, edema, fever, nausea vomiting or diarrhea. She is to be followed up in the office in 1 to 2 weeks or sooner if needed  Hx of PE - continue on Xarelto .   Plan  Patient Instructions  Recommend  you go to ER for evaluation for possible pneumonia and low oxygen levels.  Z-Pack take as directed .  Mucinex DM Twice daily  As needed  cough/congestion  Fluids and rest  Tylenol As needed   Activity as tolerated.  Prednisone 20mg  daily for 5 days  Follow up in office in 1-2 weeks with Dr. Lamonte Sakai  and As needed   Please contact office for sooner follow up if symptoms do not improve or worsen or seek emergency care     Follow Up Instructions:    I discussed the assessment and treatment plan with the patient. The patient was provided  an opportunity to ask questions and all were answered. The patient agreed with the plan and demonstrated an understanding of the instructions.   The patient was advised to call back or seek an in-person evaluation if the symptoms worsen or if the condition fails to improve as anticipated.  I provided 30  minutes of non-face-to-face time during this encounter.   Rexene Edison, NP

## 2020-12-29 ENCOUNTER — Other Ambulatory Visit: Payer: Self-pay | Admitting: Emergency Medicine

## 2020-12-29 NOTE — Telephone Encounter (Signed)
LMTCB

## 2021-01-02 ENCOUNTER — Encounter: Payer: Self-pay | Admitting: Emergency Medicine

## 2021-01-03 ENCOUNTER — Encounter: Payer: Self-pay | Admitting: Emergency Medicine

## 2021-01-04 DIAGNOSIS — F331 Major depressive disorder, recurrent, moderate: Secondary | ICD-10-CM | POA: Diagnosis not present

## 2021-01-04 DIAGNOSIS — F411 Generalized anxiety disorder: Secondary | ICD-10-CM | POA: Diagnosis not present

## 2021-01-04 NOTE — Telephone Encounter (Signed)
RB please advise. Thanks.  

## 2021-01-04 NOTE — Telephone Encounter (Signed)
RB please advise. thanks 

## 2021-01-04 NOTE — Telephone Encounter (Signed)
Unfortunately, will take a while for her cough to completely resolve - and this is contributing to her chest pain. No real intervention to make except using her tylenol or ibuprofen for pain. Since she has improved significantly, I am ok with holding off on the antibiotics.

## 2021-01-05 ENCOUNTER — Telehealth: Payer: Self-pay | Admitting: *Deleted

## 2021-01-05 NOTE — Telephone Encounter (Signed)
Called and spoke with patient, patient is feeling better.  Scheduled a f/u with Tammy Parrett for 12/12 at 3:30 pm, advised to arrive at 3:15 pm for check in .  She verbalized understanding.  Nothing further needed.

## 2021-01-11 ENCOUNTER — Other Ambulatory Visit: Payer: Self-pay

## 2021-01-11 ENCOUNTER — Ambulatory Visit (INDEPENDENT_AMBULATORY_CARE_PROVIDER_SITE_OTHER): Payer: BC Managed Care – PPO | Admitting: Adult Health

## 2021-01-11 ENCOUNTER — Encounter: Payer: Self-pay | Admitting: Adult Health

## 2021-01-11 ENCOUNTER — Ambulatory Visit (INDEPENDENT_AMBULATORY_CARE_PROVIDER_SITE_OTHER): Payer: BC Managed Care – PPO

## 2021-01-11 VITALS — BP 132/70 | HR 87 | Temp 98.7°F | Ht 66.0 in | Wt 280.0 lb

## 2021-01-11 DIAGNOSIS — J441 Chronic obstructive pulmonary disease with (acute) exacerbation: Secondary | ICD-10-CM

## 2021-01-11 DIAGNOSIS — J101 Influenza due to other identified influenza virus with other respiratory manifestations: Secondary | ICD-10-CM

## 2021-01-11 DIAGNOSIS — J09X1 Influenza due to identified novel influenza A virus with pneumonia: Secondary | ICD-10-CM

## 2021-01-11 DIAGNOSIS — J4520 Mild intermittent asthma, uncomplicated: Secondary | ICD-10-CM

## 2021-01-11 DIAGNOSIS — J189 Pneumonia, unspecified organism: Secondary | ICD-10-CM | POA: Diagnosis not present

## 2021-01-11 NOTE — Patient Instructions (Addendum)
Mucinex DM Twice daily  As needed  cough/congestion  Activity as tolerated.  Chest xray today   Albuterol inhaler As needed   Follow up as planned with PFT .  Please contact office for sooner follow up if symptoms do not improve or worsen or seek emergency care

## 2021-01-11 NOTE — Progress Notes (Signed)
@Patient  ID: Alyssa [name], female    DOB: [birth year], 60 y.o.   MRN: [id number]  Chief Complaint  Patient presents with   Follow-up    Referring provider: Samuel Bouche, NP  HPI: 60 year old female never smoker seen for pulmonary consult December 01, 2020 for shortness of breath, cough and lung nodule.  Patient was diagnosed with bilateral PE and right DVT started on anticoagulation.  (Felt to be possible provoked with immobility due to severe arthritis)   TEST/EVENTS :  CT-chest  11/03/2020  shows multiple bilateral pulmonary emboli without any evidence of right heart strain.  There was a round 12 mm left posterior costophrenic angle nodule without effusion  01/11/2021 Follow up : Influenza and pneumonia Patient returns for a 2-week follow-up.  Patient was seen for an acute office visit via video 2 weeks ago.  Patient had acute respiratory symptoms with fever, shortness of breath cough and low oxygen levels.  Patient was recommended go to the emergency room due to hypoxia.  Patient went to the emergency room chest x-ray showed multifocal pneumonia with a left upper and left lower consolidation.  Found to have influenza A.  Patient was treated with Tamiflu.  Since last visit patient says she is feeling much better.  Cough and congestion have decreased.  She denies any fever, chest pain, orthopnea, edema. Appetite is good with no nausea vomiting or diarrhea  Allergies  Allergen Reactions   Sulfa Antibiotics Hives   Lactose Intolerance (Gi)     Immunization History  Administered Date(s) Administered   Influenza Split 12/28/2012   Influenza,inj,quad, With Preservative 10/16/2015   Influenza-Unspecified 10/16/2015   PFIZER(Purple Top)SARS-COV-2 Vaccination 03/28/2019, 04/25/2019, 12/24/2019, 09/02/2020   PPD Test 10/14/2011   Pneumococcal Polysaccharide-23 08/28/2014   Tdap 11/26/2008    Past Medical History:  Diagnosis Date   Arthritis    Asthma    Costochondritis    DVT  (deep venous thrombosis) (HCC)    Hypertension    Pulmonary emboli (HCC)     Tobacco History: Social History   Tobacco Use  Smoking Status Never  Smokeless Tobacco Never   Counseling given: Not Answered   Outpatient Medications Prior to Visit  Medication Sig Dispense Refill   acetaminophen (TYLENOL) 325 MG tablet Take 2 tablets (650 mg total) by mouth every 6 (six) hours as needed for mild pain or headache.     albuterol (VENTOLIN HFA) 108 (90 Base) MCG/ACT inhaler Inhale 2 puffs into the lungs every 6 (six) hours as needed for wheezing or shortness of breath.     Azelastine HCl 137 MCG/SPRAY SOLN Place 1 spray into both nostrils 2 (two) times daily as needed (nasal congestion).     metoprolol succinate (TOPROL-XL) 25 MG 24 hr tablet Take 25 mg by mouth daily.     rivaroxaban (XARELTO) 20 MG TABS tablet Take 1 tablet (20 mg total) by mouth daily with supper. 90 tablet 2   valsartan (DIOVAN) 80 MG tablet TAKE 1 TABLET BY MOUTH EVERY DAY 90 tablet 3   tiotropium (SPIRIVA HANDIHALER) 18 MCG inhalation capsule Place 1 capsule (18 mcg total) into inhaler and inhale daily. (Patient not taking: Reported on 01/11/2021) 30 capsule 12   oseltamivir (TAMIFLU) 75 MG capsule Take 1 capsule (75 mg total) by mouth every 12 (twelve) hours. (Patient not taking: Reported on 01/11/2021) 10 capsule 0   predniSONE (DELTASONE) 20 MG tablet Take 1 tablet (20 mg total) by mouth daily with breakfast. (Patient not taking: Reported on 01/11/2021) 5 tablet  0   No facility-administered medications prior to visit.     Review of Systems:   Constitutional:   No  weight loss, night sweats,  Fevers, chills,  +fatigue, or  lassitude.  HEENT:   No headaches,  Difficulty swallowing,  Tooth/dental problems, or  Sore throat,                No sneezing, itching, ear ache, nasal congestion, post nasal drip,   CV:  No chest pain,  Orthopnea, PND, swelling in lower extremities, anasarca, dizziness, palpitations,  syncope.   GI  No heartburn, indigestion, abdominal pain, nausea, vomiting, diarrhea, change in bowel habits, loss of appetite, bloody stools.   Resp: .  No chest wall deformity  Skin: no rash or lesions.  GU: no dysuria, change in color of urine, no urgency or frequency.  No flank pain, no hematuria   MS:  No joint pain or swelling.  No decreased range of motion.  No back pain.    Physical Exam  BP 132/70 (BP Location: Left Arm, Patient Position: Sitting, Cuff Size: Large)   Pulse 87   Temp 98.7 F (37.1 C) (Oral)   Ht 5\' 6"  (1.676 m)   Wt 280 lb (127 kg)   SpO2 94%   BMI 45.19 kg/m   GEN: A/Ox3; pleasant , NAD, BMI 45    HEENT:  [place]/AT,  , NOSE-clear, THROAT-clear, no lesions, no postnasal drip or exudate noted.   NECK:  Supple w/ fair ROM; no JVD; normal carotid impulses w/o bruits; no thyromegaly or nodules palpated; no lymphadenopathy.    RESP  Clear  P & A; w/o, wheezes/ rales/ or rhonchi. no accessory muscle use, no dullness to percussion  CARD:  RRR, no m/r/g, no peripheral edema, pulses intact, no cyanosis or clubbing.  GI:   Soft & nt; nml bowel sounds; no organomegaly or masses detected.   Musco: Warm bil, no deformities or joint swelling noted.   Neuro: alert, no focal deficits noted.    Skin: Warm, no lesions or rashes    Lab Results:  CBC    BNP   ProBNP No results found for: PROBNP  Imaging: DG Chest 2 View  Result Date: 12/28/2020 CLINICAL DATA:  Cough EXAM: CHEST - 2 VIEW COMPARISON:  11/27/2020 FINDINGS: Patchy opacity within the left upper lung and left lung base. No pleural effusion. Normal cardiomediastinal silhouette. No pneumothorax. IMPRESSION: Patchy opacity in the left upper lung and left base suspicious for pneumonia. Radiographic follow-up to resolution is recommended Electronically Signed   By: Donavan Foil M.D.   On: 12/28/2020 18:05      No flowsheet data found.  No results found for: NITRICOXIDE      Assessment  & Plan:   No problem-specific Assessment & Plan notes found for this encounter.     Rexene Edison, NP 01/11/2021

## 2021-01-12 DIAGNOSIS — J09X1 Influenza due to identified novel influenza A virus with pneumonia: Secondary | ICD-10-CM

## 2021-01-12 DIAGNOSIS — J101 Influenza due to other identified influenza virus with other respiratory manifestations: Secondary | ICD-10-CM | POA: Insufficient documentation

## 2021-01-12 HISTORY — DX: Influenza due to identified novel influenza A virus with pneumonia: J09.X1

## 2021-01-12 NOTE — Assessment & Plan Note (Signed)
Left-sided pneumonia with associated influenza.  Patient is clinically improved.  Continue on current regimen.  Chest x-ray is pending

## 2021-01-12 NOTE — Assessment & Plan Note (Signed)
Recent influenza patient is clinically improving.  Completed course of Tamiflu.  Patient is continue to advance activity as tolerated and use supportive care.  Plan  Patient Instructions  Mucinex DM Twice daily  As needed  cough/congestion  Activity as tolerated.  Chest xray today   Albuterol inhaler As needed   Follow up as planned with PFT .  Please contact office for sooner follow up if symptoms do not improve or worsen or seek emergency care

## 2021-01-12 NOTE — Assessment & Plan Note (Signed)
Patient is continue on current regimen.  Albuterol as needed.  Has PFTs planned for the near future.  Plan  Patient Instructions  Mucinex DM Twice daily  As needed  cough/congestion  Activity as tolerated.  Chest xray today   Albuterol inhaler As needed   Follow up as planned with PFT .  Please contact office for sooner follow up if symptoms do not improve or worsen or seek emergency care

## 2021-01-14 ENCOUNTER — Other Ambulatory Visit: Payer: Self-pay

## 2021-01-14 ENCOUNTER — Ambulatory Visit (INDEPENDENT_AMBULATORY_CARE_PROVIDER_SITE_OTHER): Payer: BC Managed Care – PPO

## 2021-01-14 DIAGNOSIS — Z86711 Personal history of pulmonary embolism: Secondary | ICD-10-CM

## 2021-01-14 DIAGNOSIS — I517 Cardiomegaly: Secondary | ICD-10-CM

## 2021-01-14 DIAGNOSIS — R739 Hyperglycemia, unspecified: Secondary | ICD-10-CM | POA: Diagnosis not present

## 2021-01-14 DIAGNOSIS — I10 Essential (primary) hypertension: Secondary | ICD-10-CM

## 2021-01-14 DIAGNOSIS — E782 Mixed hyperlipidemia: Secondary | ICD-10-CM | POA: Diagnosis not present

## 2021-01-14 DIAGNOSIS — Z86718 Personal history of other venous thrombosis and embolism: Secondary | ICD-10-CM

## 2021-01-14 DIAGNOSIS — I5030 Unspecified diastolic (congestive) heart failure: Secondary | ICD-10-CM

## 2021-01-14 LAB — ECHOCARDIOGRAM COMPLETE
Area-P 1/2: 4.31 cm2
S' Lateral: 3.4 cm

## 2021-01-15 ENCOUNTER — Ambulatory Visit
Admission: EM | Admit: 2021-01-15 | Discharge: 2021-01-15 | Disposition: A | Discharge status: Home / Self Care | Payer: BC Managed Care – PPO

## 2021-01-15 ENCOUNTER — Telehealth: Payer: Self-pay | Admitting: Cardiology

## 2021-01-15 ENCOUNTER — Ambulatory Visit (INDEPENDENT_AMBULATORY_CARE_PROVIDER_SITE_OTHER): Payer: BC Managed Care – PPO | Admitting: Emergency Medicine

## 2021-01-15 ENCOUNTER — Encounter: Payer: Self-pay | Admitting: Emergency Medicine

## 2021-01-15 DIAGNOSIS — I2699 Other pulmonary embolism without acute cor pulmonale: Secondary | ICD-10-CM

## 2021-01-15 DIAGNOSIS — J101 Influenza due to other identified influenza virus with other respiratory manifestations: Secondary | ICD-10-CM | POA: Diagnosis not present

## 2021-01-15 DIAGNOSIS — M940 Chondrocostal junction syndrome [Tietze]: Secondary | ICD-10-CM | POA: Insufficient documentation

## 2021-01-15 DIAGNOSIS — J4520 Mild intermittent asthma, uncomplicated: Secondary | ICD-10-CM | POA: Diagnosis not present

## 2021-01-15 LAB — PULMONARY FUNCTION TEST
DL/VA % pred: 123 %
DL/VA: 5.11 ml/min/mmHg/L
DLCO cor % pred: 73 %
DLCO cor: 15.77 ml/min/mmHg
DLCO unc % pred: 71 %
DLCO unc: 15.47 ml/min/mmHg
FEF 25-75 Post: 2.26 L/sec
FEF 25-75 Pre: 2.63 L/sec
FEF2575-%Change-Post: -14 %
FEF2575-%Pred-Post: 103 %
FEF2575-%Pred-Pre: 120 %
FEV1-%Change-Post: -3 %
FEV1-%Pred-Post: 87 %
FEV1-%Pred-Pre: 90 %
FEV1-Post: 1.97 L
FEV1-Pre: 2.04 L
FEV1FVC-%Change-Post: 0 %
FEV1FVC-%Pred-Pre: 108 %
FEV6-%Change-Post: -3 %
FEV6-%Pred-Post: 82 %
FEV6-%Pred-Pre: 85 %
FEV6-Post: 2.27 L
FEV6-Pre: 2.36 L
FEV6FVC-%Pred-Post: 103 %
FEV6FVC-%Pred-Pre: 103 %
FVC-%Change-Post: -3 %
FVC-%Pred-Post: 79 %
FVC-%Pred-Pre: 82 %
FVC-Post: 2.27 L
FVC-Pre: 2.36 L
Post FEV1/FVC ratio: 87 %
Post FEV6/FVC ratio: 100 %
Pre FEV1/FVC ratio: 86 %
Pre FEV6/FVC Ratio: 100 %
RV % pred: 75 %
RV: 1.6 L
TLC % pred: 72 %
TLC: 3.86 L

## 2021-01-15 NOTE — Patient Instructions (Signed)
Full PFT performed today. °

## 2021-01-15 NOTE — Assessment & Plan Note (Addendum)
Plan to continue her anticoagulation for at least 6 months.  At that time we can assess whether she needs stay on chronic anticoagulation either therapeutic or prophylactic dosing.

## 2021-01-15 NOTE — Patient Instructions (Addendum)
Please continue your Xarelto as you have been taking it.  We will plan to continue this for 6 months total (through April).  At that time we can talk about the pros and cons of continuing anticoagulation going forward. Okay for you to travel but you need to avoid any prolonged immobility.  If you are driving or flying try to get up to walk every 1-2 hours We reviewed your pulmonary function testing today. We will not start any scheduled inhaler medication at this time Please keep your albuterol available to use 2 puffs if needed for shortness of breath, chest tightness, wheezing.  We will get you a spacer Use your Astelin nasal spray when you need it for congestion, drainage and allergies Follow Dr. Lamonte Sakai in April 2023 or sooner if you have any problems.

## 2021-01-15 NOTE — Progress Notes (Signed)
Full PFT performed today. °

## 2021-01-15 NOTE — Telephone Encounter (Signed)
Patient is returning call to discuss echo results. °

## 2021-01-15 NOTE — Assessment & Plan Note (Signed)
Improved

## 2021-01-15 NOTE — Telephone Encounter (Signed)
Patient informed of results.  

## 2021-01-15 NOTE — Assessment & Plan Note (Signed)
No clear obstruction on her pulmonary function testing today, no bronchodilator response.  I do not think we need to start scheduled inhaled medication or ICS.  She will keep her albuterol available to use if needed.

## 2021-01-15 NOTE — Progress Notes (Signed)
Subjective:    Patient ID: Alyssa [name], female    DOB: [birth year], 60 y.o.   MRN: [id number]  HPI 60 year old woman, never smoker with a history of arthritis, hypertension, asthma that was diagnosed clinically in her 2's.  I do not see any PFT.  She was evaluated beginning of 10/2020 for acute exertional dyspnea, found to have multiple bilateral PE and a right lower extremity DVT.  Started on anticoagulation.  She was also started on Incruse during that hospitalization for dyspnea (changed to Spiriva), possible component of obstructive lung disease. At baseline she uses rare albuterol during the allergy seasons. Usually showed itself by cough.  She reports today that her exertional SOB is better but not back to baseline. She was having trouble even getting through the house without SOB before she was admitted. Her cough is a bit better on the Spiriva.   She had a chole in 1999, may have had a CT abdomen at [facility]    CT-PA 11/03/2020 reviewed by me, shows multiple bilateral pulmonary emboli without any evidence of right heart strain.  There was a round 12 mm left posterior costophrenic angle nodule without effusion   ROV 01/15/21 --60 year old woman with arthritis, hypertension, bilateral pulmonary emboli diagnosed in 10/2020 with a right lower extremity DVT for which she was started on anticoagulation.  Also carries a history of asthma.  Transiently on Spiriva after her hospitalization for PE but now discontinued.  Since I saw her she had influenza in November, caused an apparent COPD/asthma flare for which she was treated with Tamiflu, prednisone.  She kept a residual cough that caused chest discomfort.  She improved, no cough. Still some congestion. Never needs albuterol. Remains on xarelto. She needs a spacer.   Pulmonary function testing performed today and reviewed by me shows evidence for possible restriction (mild) on spirometry without a bronchodilator response.  No  clear evidence for obstruction.  Lung volumes are mildly restricted.  Diffusion capacity is decreased and corrects to the normal range when adjusted for alveolar volume.     Review of Systems As per HPi  Past Medical History:  Diagnosis Date   Arthritis    Asthma    Costochondritis    DVT (deep venous thrombosis) (HCC)    Hypertension    Pulmonary emboli (HCC)      Family History  Problem Relation Age of Onset   Congestive Heart Failure Mother    Stroke Mother    Heart disease Brother        Pacemaker       Social History   Socioeconomic History   Marital status: Single    Spouse name: Not on file   Number of children: Not on file   Years of education: Not on file   Highest education level: Not on file  Occupational History   Not on file  Tobacco Use   Smoking status: Never   Smokeless tobacco: Never  Substance and Sexual Activity   Alcohol use: Never   Drug use: Never   Sexual activity: Not on file  Other Topics Concern   Not on file  Social History Narrative   Not on file   Social Determinants of Health   Financial Resource Strain: Not on file  Food Insecurity: Not on file  Transportation Needs: Not on file  Physical Activity: Not on file  Stress: Not on file  Social Connections: Not on file  Intimate Partner Violence: Not on file    From [place],  has been in [place] for 16 yrs Has worked as a Network engineer.   Allergies  Allergen Reactions   Sulfa Antibiotics Hives   Lactose Intolerance (Gi)      Outpatient Medications Prior to Visit  Medication Sig Dispense Refill   acetaminophen (TYLENOL) 325 MG tablet Take 2 tablets (650 mg total) by mouth every 6 (six) hours as needed for mild pain or headache.     albuterol (VENTOLIN HFA) 108 (90 Base) MCG/ACT inhaler Inhale 2 puffs into the lungs every 6 (six) hours as needed for wheezing or shortness of breath.     Azelastine HCl 137 MCG/SPRAY SOLN Place 1 spray into both nostrils 2 (two) times daily as needed  (nasal congestion).     metoprolol succinate (TOPROL-XL) 25 MG 24 hr tablet Take 25 mg by mouth daily.     rivaroxaban (XARELTO) 20 MG TABS tablet Take 1 tablet (20 mg total) by mouth daily with supper. 90 tablet 2   valsartan (DIOVAN) 80 MG tablet TAKE 1 TABLET BY MOUTH EVERY DAY 90 tablet 3   tiotropium (SPIRIVA HANDIHALER) 18 MCG inhalation capsule Place 1 capsule (18 mcg total) into inhaler and inhale daily. (Patient not taking: Reported on 01/15/2021) 30 capsule 12   No facility-administered medications prior to visit.         Objective:   Physical Exam Vitals:   01/15/21 1156  BP: 136/82  Pulse: 82  Temp: 98.1 F (36.7 C)  TempSrc: Oral  SpO2: 96%  Weight: 279 lb (126.6 kg)  Height: 5\' 6"  (1.676 m)   Gen: Pleasant, obese woman, in no distress,  normal affect  ENT: No lesions,  mouth clear,  oropharynx clear, no postnasal drip  Neck: No JVD, no stridor  Lungs: No use of accessory muscles, decreased at both bases, no crackles or wheezing on normal respiration, no wheeze on forced expiration  Cardiovascular: RRR, heart sounds normal, no murmur or gallops, no peripheral edema  Musculoskeletal: No deformities, no cyanosis or clubbing  Neuro: alert, awake, non focal  Skin: Warm, no lesions or rash      Assessment & Plan:   Influenza A Improved  Asthma No clear obstruction on her pulmonary function testing today, no bronchodilator response.  I do not think we need to start scheduled inhaled medication or ICS.  She will keep her albuterol available to use if needed.  Pulmonary embolism ([place]) Plan to continue her anticoagulation for at least 6 months.  At that time we can assess whether she needs stay on chronic anticoagulation either therapeutic or prophylactic dosing.   Baltazar Apo, MD, PhD 01/15/2021, 12:34 PM [facility] [phone] or if no answer before 7:00PM call [phone] For any issues after 7:00PM please call eLink  [phone]

## 2021-01-15 NOTE — ED Notes (Signed)
Patient presents to [facility] for COVID testing, however, she was hoping for a rapid test for an appointment today at 11am.  After making her aware that ours are PCR and send-out, she decided not to stay.  Will d/c.

## 2021-01-19 ENCOUNTER — Encounter: Payer: Self-pay | Admitting: Medical-Surgical

## 2021-01-19 ENCOUNTER — Other Ambulatory Visit: Payer: Self-pay

## 2021-01-19 ENCOUNTER — Ambulatory Visit (INDEPENDENT_AMBULATORY_CARE_PROVIDER_SITE_OTHER): Payer: BC Managed Care – PPO | Admitting: Medical-Surgical

## 2021-01-19 VITALS — BP 135/88 | HR 89 | Resp 20 | Ht 66.0 in | Wt 279.0 lb

## 2021-01-19 DIAGNOSIS — F5104 Psychophysiologic insomnia: Secondary | ICD-10-CM

## 2021-01-19 DIAGNOSIS — Z7689 Persons encountering health services in other specified circumstances: Secondary | ICD-10-CM

## 2021-01-19 DIAGNOSIS — Z1231 Encounter for screening mammogram for malignant neoplasm of breast: Secondary | ICD-10-CM

## 2021-01-19 DIAGNOSIS — I10 Essential (primary) hypertension: Secondary | ICD-10-CM

## 2021-01-19 DIAGNOSIS — F419 Anxiety disorder, unspecified: Secondary | ICD-10-CM | POA: Diagnosis not present

## 2021-01-19 MED ORDER — TRAZODONE HCL 50 MG PO TABS: 25.0000 mg | ORAL_TABLET | Freq: Every evening | ORAL | 3 refills | Status: DC | PRN

## 2021-01-19 MED ORDER — RIVAROXABAN 20 MG PO TABS: 20.0000 mg | ORAL_TABLET | Freq: Every day | ORAL | 1 refills | Status: DC

## 2021-01-19 NOTE — Progress Notes (Signed)
New Patient Office Visit  Subjective:  Patient ID: Alyssa [name], female    DOB: [birth year]  Age: 60 y.o. MRN: [id number]  CC:  Chief Complaint  Patient presents with   Establish Care     HPI Alyssa [name] presents to establish care.  She is a very pleasant 60 year old female who has recently undergone quite the healthcare odyssey.  She unfortunately developed a DVT in her leg followed by PEs bilaterally.  This was in October and she has been struggling since then with flu on top of her recovery.  She is followed by cardiology as well as pulmonology.  She is currently taking Xarelto 20 mg daily and will be on this for approximately 6 months.  Her blood pressure is managed with valsartan as well as Toprol-XL.  She is taking these medications, tolerating well without side effects.  She has a follow-up scheduled with cardiology in a couple of days and is keeping close follow-ups with pulmonology as well.  She does have a history of anxiety but this has been worse since finding out she had blood clots in her lungs.  Often she will wake early in the morning hours in a panic because she has blood clots in her lungs and they have not resolved.  She is able to talk her self down most nights although some nights do take longer than others.  She is not currently taking anything for sleep or anxiety but notes that she is willing to take something if it will help her sleep through the night without having these panic attacks.  Past Medical History:  Diagnosis Date   Anxiety 05/2018   Arthritis    Asthma    Costochondritis    DVT (deep venous thrombosis) (HCC)    GERD (gastroesophageal reflux disease)    Hypertension    Pulmonary emboli (HCC)     Past Surgical History:  Procedure Laterality Date   CESAREAN SECTION     CHOLECYSTECTOMY     KNEE ARTHROSCOPY W/ MENISCAL REPAIR Left    LUMBAR [place] ARTHROPLASTY     SPINE SURGERY  2006    Family History  Problem Relation Age of Onset    Congestive Heart Failure Mother    Stroke Mother    Heart disease Mother    Hypertension Mother    Heart disease Brother        Pacemaker    Social History   Socioeconomic History   Marital status: Single    Spouse name: Not on file   Number of children: Not on file   Years of education: Not on file   Highest education level: Not on file  Occupational History   Not on file  Tobacco Use   Smoking status: Never   Smokeless tobacco: Never   Tobacco comments:    Exposure to second hand smoke  Substance and Sexual Activity   Alcohol use: Never   Drug use: Never   Sexual activity: Not Currently    Birth control/protection: None  Other Topics Concern   Not on file  Social History Narrative   Not on file   Social Determinants of Health   Financial Resource Strain: Not on file  Food Insecurity: Not on file  Transportation Needs: Not on file  Physical Activity: Not on file  Stress: Not on file  Social Connections: Not on file  Intimate Partner Violence: Not on file    ROS Review of Systems  Constitutional:  Positive for fatigue. Negative for chills,  fever and unexpected weight change.  HENT:  Negative for congestion, rhinorrhea, sinus pressure and sore throat.   Respiratory:  Positive for shortness of breath. Negative for cough, chest tightness and wheezing.   Cardiovascular:  Negative for chest pain, palpitations and leg swelling.  Gastrointestinal:  Negative for abdominal pain, constipation, diarrhea, nausea and vomiting.  Endocrine: Negative for cold intolerance and heat intolerance.  Genitourinary:  Negative for dysuria, frequency and urgency.  Skin:  Negative for rash and wound.  Neurological:  Negative for dizziness, light-headedness and headaches.  Hematological:  Does not bruise/bleed easily.  Psychiatric/Behavioral:  Negative for dysphoric mood, self-injury, sleep disturbance and suicidal ideas. The patient is nervous/anxious.    Objective:   Today's Vitals:  BP 135/88 (BP Location: Left Arm, Patient Position: Sitting, Cuff Size: Normal)    Pulse 89    Resp 20    Ht 5\' 6"  (1.676 m)    Wt 279 lb (126.6 kg)    SpO2 96%    BMI 45.03 kg/m   Physical Exam Vitals reviewed.  Constitutional:      General: She is not in acute distress.    Appearance: Normal appearance. She is obese. She is not ill-appearing.  HENT:     Head: Normocephalic and atraumatic.  Cardiovascular:     Rate and Rhythm: Normal rate and regular rhythm.     Pulses: Normal pulses.     Heart sounds: Normal heart sounds. No murmur heard.   No friction rub. No gallop.  Pulmonary:     Effort: Pulmonary effort is normal. No respiratory distress.     Breath sounds: Normal breath sounds. No wheezing.  Skin:    General: Skin is warm and dry.  Neurological:     Mental Status: She is alert and oriented to person, place, and time.  Psychiatric:        Mood and Affect: Mood normal.        Behavior: Behavior normal.        Thought Content: Thought content normal.        Judgment: Judgment normal.    Assessment & Plan:   1. Encounter to establish care Reviewed available information and discussed care concerns with patient.   2. Primary hypertension Blood pressure at goal today 135/88 although it is a little higher than her recommended/typical.  She is completely asymptomatic and she does see cardiology on Friday so we will let her follow-up with them before making any changes to her current medications.  Refilling Xarelto 20 mg daily.  3. Screening mammogram, encounter for Mammogram ordered. - MM 3D SCREEN BREAST BILATERAL; Future  4. Anxiety 5. Psychophysiological insomnia Starting trazodone 25-50 mg nightly as needed for sleep   Outpatient Encounter Medications as of 01/19/2021  Medication Sig   acetaminophen (TYLENOL) 325 MG tablet Take 2 tablets (650 mg total) by mouth every 6 (six) hours as needed for mild pain or headache.   albuterol (VENTOLIN HFA) 108 (90 Base) MCG/ACT  inhaler Inhale 2 puffs into the lungs every 6 (six) hours as needed for wheezing or shortness of breath.   Azelastine HCl 137 MCG/SPRAY SOLN Place 1 spray into both nostrils 2 (two) times daily as needed (nasal congestion).   metoprolol succinate (TOPROL-XL) 25 MG 24 hr tablet Take 25 mg by mouth daily.   traZODone (DESYREL) 50 MG tablet Take 0.5-1 tablets (25-50 mg total) by mouth at bedtime as needed for sleep.   valsartan (DIOVAN) 80 MG tablet TAKE 1 TABLET BY MOUTH EVERY  DAY   [DISCONTINUED] rivaroxaban (XARELTO) 20 MG TABS tablet Take 1 tablet (20 mg total) by mouth daily with supper.   rivaroxaban (XARELTO) 20 MG TABS tablet Take 1 tablet (20 mg total) by mouth daily with supper.   [DISCONTINUED] tiotropium (SPIRIVA HANDIHALER) 18 MCG inhalation capsule Place 1 capsule (18 mcg total) into inhaler and inhale daily. (Patient not taking: Reported on 01/15/2021)   No facility-administered encounter medications on file as of 01/19/2021.   Follow-up: Return for annual physical exam at your convenience.   Clearnce Sorrel, DNP, APRN, FNP-BC [facility]

## 2021-01-20 ENCOUNTER — Telehealth: Payer: Self-pay | Admitting: Cardiology

## 2021-01-20 NOTE — Telephone Encounter (Signed)
Patient requesting her appointment be virtual Friday due to the weather.

## 2021-01-20 NOTE — Telephone Encounter (Signed)
Appointment changed to virtual as requested. Message left for pt on VM.

## 2021-01-22 ENCOUNTER — Encounter: Payer: Self-pay | Admitting: Cardiology

## 2021-01-22 ENCOUNTER — Telehealth (INDEPENDENT_AMBULATORY_CARE_PROVIDER_SITE_OTHER): Payer: BC Managed Care – PPO | Admitting: Cardiology

## 2021-01-22 VITALS — BP 133/88 | HR 99 | Ht 66.0 in | Wt 279.0 lb

## 2021-01-22 DIAGNOSIS — Z86718 Personal history of other venous thrombosis and embolism: Secondary | ICD-10-CM

## 2021-01-22 DIAGNOSIS — I10 Essential (primary) hypertension: Secondary | ICD-10-CM

## 2021-01-22 DIAGNOSIS — E782 Mixed hyperlipidemia: Secondary | ICD-10-CM

## 2021-01-22 DIAGNOSIS — Z86711 Personal history of pulmonary embolism: Secondary | ICD-10-CM | POA: Diagnosis not present

## 2021-01-22 NOTE — Progress Notes (Signed)
Virtual Visit via Video Note   This visit type was conducted due to national recommendations for restrictions regarding the COVID-19 Pandemic (e.g. social distancing) in an effort to limit this patient's exposure and mitigate transmission in our community.  Due to her co-morbid illnesses, this patient is at least at moderate risk for complications without adequate follow up.  This format is felt to be most appropriate for this patient at this time.  All issues noted in this document were discussed and addressed.  A limited physical exam was performed with this format.  Please refer to the patient's chart for her consent to telehealth for [facility].     Evaluation Performed:  Follow-up visit  This visit type was conducted due to national recommendations for restrictions regarding the COVID-19 Pandemic (e.g. social distancing).  This format is felt to be most appropriate for this patient at this time.  All issues noted in this document were discussed and addressed.  No physical exam was performed (except for noted visual exam findings with Video Visits).  Please refer to the patient's chart (MyChart message for video visits and phone note for telephone visits) for the patient's consent to telehealth for [facility].  Date:  01/22/2021  ID: Alyssa [name], DOB [birth year], MRN [id number]   Patient Location: [place] [id number]   Provider location:   [place] Office  PCP:  Samuel Bouche, NP  Cardiologist:  Jenne Campus, MD     Chief Complaint: I am here just for regular follow-up  History of Present Illness:    Alyssa [name] is a 60 y.o. female  who presents via audio/video conferencing for a telehealth visit today.  With past medical history significant for DVT and pulmonary emboli, essential hypertension, dyslipidemia, borderline diabetes.  She is being followed with me because of atypical chest pain.  Chest pain look very pleuritic and  may also possibly costochondritis.  Overall she was sick with some pneumonia couple weeks ago but actually getting much better and very happy and satisfied.  She prefers a virtual visit today because of weather that we have.  Denies having any palpitations no shortness of breath.   The patient does not have symptoms concerning for COVID-19 infection (fever, chills, cough, or new SHORTNESS OF BREATH).    Prior CV studies:   The following studies were reviewed today:  I did review echocardiogram done recently showed pulmonary artery pressure in the neighborhood of 36 mmHg, normal left ventricle ejection fraction but there is diastolic dysfunction     Past Medical History:  Diagnosis Date   Anxiety 05/2018   Arthritis    Asthma    Costochondritis    DVT (deep venous thrombosis) (HCC)    GERD (gastroesophageal reflux disease)    Hypertension    Pulmonary emboli (HCC)     Past Surgical History:  Procedure Laterality Date   CESAREAN SECTION     CHOLECYSTECTOMY     KNEE ARTHROSCOPY W/ MENISCAL REPAIR Left    LUMBAR [place] ARTHROPLASTY     SPINE SURGERY  2006     Current Meds  Medication Sig   acetaminophen (TYLENOL) 325 MG tablet Take 2 tablets (650 mg total) by mouth every 6 (six) hours as needed for mild pain or headache.   albuterol (VENTOLIN HFA) 108 (90 Base) MCG/ACT inhaler Inhale 2 puffs into the lungs every 6 (six) hours as needed for wheezing or shortness of breath.   Azelastine HCl 137 MCG/SPRAY SOLN Place  1 spray into both nostrils 2 (two) times daily as needed (nasal congestion).   metoprolol succinate (TOPROL-XL) 25 MG 24 hr tablet Take 25 mg by mouth daily.   rivaroxaban (XARELTO) 20 MG TABS tablet Take 1 tablet (20 mg total) by mouth daily with supper.   traZODone (DESYREL) 50 MG tablet Take 0.5-1 tablets (25-50 mg total) by mouth at bedtime as needed for sleep.   valsartan (DIOVAN) 80 MG tablet Take 80 mg by mouth daily.      Family History: The patient's  family history includes Congestive Heart Failure in her mother; Heart disease in her brother and mother; Hypertension in her mother; Stroke in her mother.   ROS:   Please see the history of present illness.     All other systems reviewed and are negative.   Labs/Other Tests and Data Reviewed:     Recent Labs: 11/03/2020: B Natriuretic Peptide 19.8 11/27/2020: ALT 11 12/28/2020: BUN 12; Creatinine, Ser 1.10; Hemoglobin 12.8; Platelets 202; Potassium 3.6; Sodium 137  Recent Lipid Panel No results found for: CHOL, TRIG, HDL, CHOLHDL, VLDL, LDLCALC, LDLDIRECT    Exam:    Vital Signs:  BP 133/88    Pulse 99    Ht 5\' 6"  (1.676 m)    Wt 279 lb (126.6 kg)    SpO2 95%    BMI 45.03 kg/m     Wt Readings from Last 3 Encounters:  01/22/21 279 lb (126.6 kg)  01/19/21 279 lb (126.6 kg)  01/15/21 279 lb (126.6 kg)     Well nourished, well developed in no acute distress. Alert awake and x3 she is talking to me over the video link she looks good not in any distress very happy.  Diagnosis for this visit:   1. History of pulmonary embolism   2. History of DVT (deep vein thrombosis)   3. Primary hypertension   4. Hyperlipidemia, mixed      ASSESSMENT & PLAN:    1.  History of pulmonary emboli and DVT.  Anticoagulated which we will continue.  Thinking is that this is probably provoked pulmonary emboli DVT.  She is being followed by pulmonary as well.  Luckily echocardiogram did not show any significant increase in pulmonary artery pressure.  Overall that looks good. 2.  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  Likely she does have only mild shortness of breath.  I will ask her to have Chem-7 done today we probably will add some extra medication to help her blood pressure which is still not well controlled.  We either increase beta-blocker which can be very helpful in this clinical scenario I will try to increase dose  of ARB. 3.  Essential hypertension still uncontrolled plan as described above 4.  Dyslipidemia I do not see any recent fasting lipid profile will do the test. 5.  I did review extensive record for this visit but from pulmonologist as well as from emergency room visit that she actually did not have  COVID-19 Education: The signs and symptoms of COVID-19 were discussed with the patient and how to seek care for testing (follow up with PCP or arrange E-visit).  The importance of social distancing was discussed today.  Patient Risk:   After full review of this patients clinical status, I feel that they are at least moderate risk at this time.  Time:   Today, I have spent 20 minutes with the patient with telehealth technology  discussing pt health issues.  I spent 15 minutes reviewing her chart before the visit.  Visit was finished at 9:19 AM.    Medication Adjustments/Labs and Tests Ordered: Current medicines are reviewed at length with the patient today.  Concerns regarding medicines are outlined above.  No orders of the defined types were placed in this encounter.  Medication changes: No orders of the defined types were placed in this encounter.    Disposition: Follow-up 6 months  Signed, Park Liter, MD, [facility] 01/22/2021 9:16 AM    [place]

## 2021-01-22 NOTE — Patient Instructions (Signed)
Medication Instructions:  Your physician recommends that you continue on your current medications as directed. Please refer to the Current Medication list given to you today.  *If you need a refill on your cardiac medications before your next appointment, please call your pharmacy*   Lab Work: Your physician recommends that you return for lab work in: next week You need to have labs done when you are fasting.  You can come Monday through Friday 8:30 am to 12:00 pm and 1:15 to 4:30. You do not need to make an appointment as the order has already been placed. The labs you are going to have done are BMET and Lipids.  If you have labs (blood work) drawn today and your tests are completely normal, you will receive your results only by: [place] (if you have MyChart) OR A paper copy in the mail If you have any lab test that is abnormal or we need to change your treatment, we will call you to review the results.   Testing/Procedures: None ordered   Follow-Up: At [facility], you and your health needs are our priority.  As part of our continuing mission to provide you with exceptional heart care, we have created designated Provider Care Teams.  These Care Teams include your primary Cardiologist (physician) and Advanced Practice Providers (APPs -  Physician Assistants and Nurse Practitioners) who all work together to provide you with the care you need, when you need it.  We recommend signing up for the patient portal called "MyChart".  Sign up information is provided on this After Visit Summary.  MyChart is used to connect with patients for Virtual Visits (Telemedicine).  Patients are able to view lab/test results, encounter notes, upcoming appointments, etc.  Non-urgent messages can be sent to your provider as well.   To learn more about what you can do with MyChart, go to [email or web address].    Your next appointment:   6 month(s)  The format for your next appointment:   In  Person  Provider:   Jenne Campus, MD   Other Instructions NA

## 2021-01-22 NOTE — Addendum Note (Signed)
Addended by: Truddie Hidden on: 01/22/2021 10:31 AM   Modules accepted: Orders

## 2021-01-26 DIAGNOSIS — E782 Mixed hyperlipidemia: Secondary | ICD-10-CM | POA: Diagnosis not present

## 2021-01-26 DIAGNOSIS — I10 Essential (primary) hypertension: Secondary | ICD-10-CM | POA: Diagnosis not present

## 2021-01-26 DIAGNOSIS — Z86718 Personal history of other venous thrombosis and embolism: Secondary | ICD-10-CM | POA: Diagnosis not present

## 2021-01-26 DIAGNOSIS — Z86711 Personal history of pulmonary embolism: Secondary | ICD-10-CM | POA: Diagnosis not present

## 2021-01-27 ENCOUNTER — Encounter: Payer: Self-pay | Admitting: Medical-Surgical

## 2021-01-27 LAB — BASIC METABOLIC PANEL
BUN/Creatinine Ratio: 14 (ref 12–28)
BUN: 13 mg/dL (ref 8–27)
CO2: 23 mmol/L (ref 20–29)
Calcium: 9.2 mg/dL (ref 8.7–10.3)
Chloride: 108 mmol/L — ABNORMAL HIGH (ref 96–106)
Creatinine, Ser: 0.91 mg/dL (ref 0.57–1.00)
Glucose: 88 mg/dL (ref 70–99)
Potassium: 4.3 mmol/L (ref 3.5–5.2)
Sodium: 143 mmol/L (ref 134–144)
eGFR: 72 mL/min/{1.73_m2} (ref >59)

## 2021-01-27 LAB — LIPID PANEL
Chol/HDL Ratio: 3.7 ratio (ref 0.0–4.4)
Cholesterol, Total: 144 mg/dL (ref 100–199)
HDL: 39 mg/dL — ABNORMAL LOW (ref >39)
LDL Chol Calc (NIH): 84 mg/dL (ref 0–99)
Triglycerides: 115 mg/dL (ref 0–149)
VLDL Cholesterol Cal: 21 mg/dL (ref 5–40)

## 2021-01-29 MED ORDER — VALSARTAN 80 MG PO TABS: 160.0000 mg | ORAL_TABLET | Freq: Every day | ORAL | Status: DC

## 2021-01-29 NOTE — Addendum Note (Signed)
Addended by: Truddie Hidden on: 01/29/2021 04:17 PM   Modules accepted: Orders

## 2021-02-03 ENCOUNTER — Encounter: Payer: Self-pay | Admitting: Emergency Medicine

## 2021-02-04 NOTE — Telephone Encounter (Signed)
Good morning Dr. Lamonte Sakai, Please see mychart message and advise.  Thank you.

## 2021-02-05 NOTE — Telephone Encounter (Signed)
Yes, she can use both of these

## 2021-02-09 ENCOUNTER — Encounter: Payer: Self-pay | Admitting: Cardiology

## 2021-02-09 DIAGNOSIS — I10 Essential (primary) hypertension: Secondary | ICD-10-CM | POA: Diagnosis not present

## 2021-02-09 DIAGNOSIS — Z86711 Personal history of pulmonary embolism: Secondary | ICD-10-CM | POA: Diagnosis not present

## 2021-02-09 DIAGNOSIS — E782 Mixed hyperlipidemia: Secondary | ICD-10-CM | POA: Diagnosis not present

## 2021-02-09 DIAGNOSIS — Z86718 Personal history of other venous thrombosis and embolism: Secondary | ICD-10-CM | POA: Diagnosis not present

## 2021-02-10 ENCOUNTER — Other Ambulatory Visit: Payer: Self-pay | Admitting: Medical-Surgical

## 2021-02-10 ENCOUNTER — Ambulatory Visit: Payer: BC Managed Care – PPO | Admitting: Emergency Medicine

## 2021-02-10 LAB — BASIC METABOLIC PANEL
BUN/Creatinine Ratio: 11 — ABNORMAL LOW (ref 12–28)
BUN: 12 mg/dL (ref 8–27)
CO2: 23 mmol/L (ref 20–29)
Calcium: 9.2 mg/dL (ref 8.7–10.3)
Chloride: 104 mmol/L (ref 96–106)
Creatinine, Ser: 1.12 mg/dL — ABNORMAL HIGH (ref 0.57–1.00)
Glucose: 85 mg/dL (ref 70–99)
Potassium: 4.5 mmol/L (ref 3.5–5.2)
Sodium: 139 mmol/L (ref 134–144)
eGFR: 56 mL/min/{1.73_m2} — ABNORMAL LOW (ref >59)

## 2021-02-11 ENCOUNTER — Telehealth: Payer: Self-pay

## 2021-02-11 MED ORDER — VALSARTAN 80 MG PO TABS: 160.0000 mg | ORAL_TABLET | Freq: Every day | ORAL | 3 refills | Status: DC

## 2021-02-11 NOTE — Telephone Encounter (Signed)
Informed patient of results

## 2021-02-16 ENCOUNTER — Ambulatory Visit: Payer: BC Managed Care – PPO | Admitting: Internal Medicine

## 2021-02-19 ENCOUNTER — Emergency Department (HOSPITAL_BASED_OUTPATIENT_CLINIC_OR_DEPARTMENT_OTHER): Payer: BC Managed Care – PPO

## 2021-02-19 ENCOUNTER — Other Ambulatory Visit: Payer: Self-pay

## 2021-02-19 ENCOUNTER — Emergency Department (HOSPITAL_BASED_OUTPATIENT_CLINIC_OR_DEPARTMENT_OTHER)
Admission: EM | Admit: 2021-02-19 | Discharge: 2021-02-19 | Disposition: A | Discharge status: Home / Self Care | Payer: BC Managed Care – PPO | Attending: Emergency Medicine | Admitting: Emergency Medicine

## 2021-02-19 ENCOUNTER — Encounter (HOSPITAL_BASED_OUTPATIENT_CLINIC_OR_DEPARTMENT_OTHER): Payer: Self-pay | Admitting: Emergency Medicine

## 2021-02-19 DIAGNOSIS — R0602 Shortness of breath: Secondary | ICD-10-CM | POA: Insufficient documentation

## 2021-02-19 DIAGNOSIS — Z7901 Long term (current) use of anticoagulants: Secondary | ICD-10-CM | POA: Diagnosis not present

## 2021-02-19 DIAGNOSIS — R0600 Dyspnea, unspecified: Secondary | ICD-10-CM

## 2021-02-19 DIAGNOSIS — Z79899 Other long term (current) drug therapy: Secondary | ICD-10-CM | POA: Insufficient documentation

## 2021-02-19 LAB — BRAIN NATRIURETIC PEPTIDE: B Natriuretic Peptide: 27.6 pg/mL (ref 0.0–100.0)

## 2021-02-19 LAB — CBC WITH DIFFERENTIAL/PLATELET
Abs Immature Granulocytes: 0.01 10*3/uL (ref 0.00–0.07)
Basophils Absolute: 0 10*3/uL (ref 0.0–0.1)
Basophils Relative: 1 %
Eosinophils Absolute: 0.1 10*3/uL (ref 0.0–0.5)
Eosinophils Relative: 2 %
HCT: 37.1 % (ref 36.0–46.0)
Hemoglobin: 11.8 g/dL — ABNORMAL LOW (ref 12.0–15.0)
Immature Granulocytes: 0 %
Lymphocytes Relative: 27 %
Lymphs Abs: 1.5 10*3/uL (ref 0.7–4.0)
MCH: 24.7 pg — ABNORMAL LOW (ref 26.0–34.0)
MCHC: 31.8 g/dL (ref 30.0–36.0)
MCV: 77.8 fL — ABNORMAL LOW (ref 80.0–100.0)
Monocytes Absolute: 0.4 10*3/uL (ref 0.1–1.0)
Monocytes Relative: 7 %
Neutro Abs: 3.5 10*3/uL (ref 1.7–7.7)
Neutrophils Relative %: 63 %
Platelets: 268 10*3/uL (ref 150–400)
RBC: 4.77 MIL/uL (ref 3.87–5.11)
RDW: 15.9 % — ABNORMAL HIGH (ref 11.5–15.5)
WBC: 5.4 10*3/uL (ref 4.0–10.5)
nRBC: 0 % (ref 0.0–0.2)

## 2021-02-19 LAB — BASIC METABOLIC PANEL
Anion gap: 7 (ref 5–15)
BUN: 13 mg/dL (ref 8–23)
CO2: 26 mmol/L (ref 22–32)
Calcium: 9.1 mg/dL (ref 8.9–10.3)
Chloride: 107 mmol/L (ref 98–111)
Creatinine, Ser: 0.99 mg/dL (ref 0.44–1.00)
GFR, Estimated: >60 mL/min (ref >60)
Glucose, Bld: 96 mg/dL (ref 70–99)
Potassium: 3.8 mmol/L (ref 3.5–5.1)
Sodium: 140 mmol/L (ref 135–145)

## 2021-02-19 LAB — TROPONIN I (HIGH SENSITIVITY)
Troponin I (High Sensitivity): 3 ng/L (ref <18)
Troponin I (High Sensitivity): 3 ng/L (ref <18)

## 2021-02-19 MED ORDER — IOHEXOL 350 MG/ML SOLN
80.0000 mL | Freq: Once | INTRAVENOUS | Status: AC | PRN
  Administered 2021-02-19: 80 mL via INTRAVENOUS

## 2021-02-19 NOTE — ED Provider Notes (Signed)
[facility] EMERGENCY DEPARTMENT Provider Note   CSN: [id number] Arrival date & time: 02/19/21  1928     History  Chief Complaint  Patient presents with   Shortness of Breath    Intermittent     Alyssa [name] is a 61 y.o. female.  Presents to the emergency room with concern for shortness of breath.  Patient reports that over the last couple weeks she has been noting intermittent episodes of shortness of breath, seems to come and go at random, no particular pattern, sometimes occurs at rest and symptoms with exertion.  Currently does not feel particularly short of breath.  Does not have any chest pain.  No lightheadedness or passing out, no cough or fever.  Does not feel nearly as severe as her breathing issues while she was admitted for her blood clot.  States that she regularly takes her Xarelto and has not had any missed doses.  Completed chart review, patient was diagnosed with pulmonary embolism in October 2022, reviewed discharge summary, ER visit and CT imaging at the time.  HPI     Home Medications Prior to Admission medications   Medication Sig Start Date End Date Taking? Authorizing Provider  acetaminophen (TYLENOL) 325 MG tablet Take 2 tablets (650 mg total) by mouth every 6 (six) hours as needed for mild pain or headache. 11/05/20   Oswald Hillock, MD  albuterol (VENTOLIN HFA) 108 (90 Base) MCG/ACT inhaler Inhale 2 puffs into the lungs every 6 (six) hours as needed for wheezing or shortness of breath. 10/16/20   [provider]  Azelastine HCl 137 MCG/SPRAY SOLN Place 1 spray into both nostrils 2 (two) times daily as needed (nasal congestion). 10/27/20   [provider]  metoprolol succinate (TOPROL-XL) 25 MG 24 hr tablet Take 25 mg by mouth daily. 10/16/20   [provider]  rivaroxaban (XARELTO) 20 MG TABS tablet Take 1 tablet (20 mg total) by mouth daily with supper. 01/19/21   Samuel Bouche, NP  traZODone (DESYREL) 50 MG tablet TAKE  1/2-1 TABLET BY MOUTH AT BEDTIME AS NEEDED FOR SLEEP. 02/10/21   Samuel Bouche, NP  valsartan (DIOVAN) 80 MG tablet Take 2 tablets (160 mg total) by mouth daily. 02/11/21   Park Liter, MD      Allergies    Sulfa antibiotics and Lactose intolerance (gi)    Review of Systems   Review of Systems  Respiratory:  Positive for shortness of breath.   All other systems reviewed and are negative.  Physical Exam Updated Vital Signs BP 102/83    Pulse 76    Temp 98 F (36.7 C) (Oral)    Resp 11    Ht 5\' 6"  (1.676 m)    Wt 122 kg    SpO2 98%    BMI 43.42 kg/m  Physical Exam Vitals and nursing note reviewed.  Constitutional:      General: She is not in acute distress.    Appearance: She is well-developed.  HENT:     Head: Normocephalic and atraumatic.  Eyes:     Conjunctiva/sclera: Conjunctivae normal.  Cardiovascular:     Rate and Rhythm: Normal rate and regular rhythm.     Heart sounds: No murmur heard. Pulmonary:     Effort: Pulmonary effort is normal. No respiratory distress.     Breath sounds: Normal breath sounds.  Abdominal:     Palpations: Abdomen is soft.     Tenderness: There is no abdominal tenderness.  Musculoskeletal:  General: No swelling.     Cervical back: Neck supple.  Skin:    General: Skin is warm and dry.     Capillary Refill: Capillary refill takes less than 2 seconds.  Neurological:     Mental Status: She is alert.  Psychiatric:        Mood and Affect: Mood normal.    ED Results / Procedures / Treatments   Labs (all labs ordered are listed, but only abnormal results are displayed) Labs Reviewed  CBC WITH DIFFERENTIAL/PLATELET - Abnormal; Notable for the following components:      Result Value   Hemoglobin 11.8 (*)    MCV 77.8 (*)    MCH 24.7 (*)    RDW 15.9 (*)    All other components within normal limits  BASIC METABOLIC PANEL  BRAIN NATRIURETIC PEPTIDE  TROPONIN I (HIGH SENSITIVITY)  TROPONIN I (HIGH SENSITIVITY)    EKG EKG  Interpretation  Date/Time:  Friday February 19 2021 20:07:10 EST Ventricular Rate:  74 PR Interval:  146 QRS Duration: 78 QT Interval:  392 QTC Calculation: 435 R Axis:   -28 Text Interpretation: Sinus rhythm Borderline left axis deviation Low voltage, precordial leads Borderline T abnormalities, anterior leads Confirmed by Madalyn Rob [phone]) on 02/19/2021 8:11:52 PM  Radiology DG Chest 2 View  Result Date: 02/19/2021 CLINICAL DATA:  Shortness of breath. EXAM: CHEST - 2 VIEW COMPARISON:  January 11, 2021 FINDINGS: The heart size and mediastinal contours are within normal limits. Both lungs are clear. The visualized skeletal structures are unremarkable. IMPRESSION: No active cardiopulmonary disease. Electronically Signed   By: Virgina Norfolk M.D.   On: 02/19/2021 20:55   CT Angio Chest PE W and/or Wo Contrast  Result Date: 02/19/2021 CLINICAL DATA:  Shortness of breath with history of pulmonary embolism. EXAM: CT ANGIOGRAPHY CHEST WITH CONTRAST TECHNIQUE: Multidetector CT imaging of the chest was performed using the standard protocol during bolus administration of intravenous contrast. Multiplanar CT image reconstructions and MIPs were obtained to evaluate the vascular anatomy. RADIATION DOSE REDUCTION: This exam was performed according to the departmental dose-optimization program which includes automated exposure control, adjustment of the mA and/or kV according to patient size and/or use of iterative reconstruction technique. CONTRAST:  49mL OMNIPAQUE IOHEXOL 350 MG/ML SOLN COMPARISON:  November 03, 2020 FINDINGS: Cardiovascular: Satisfactory opacification of the pulmonary arteries to the segmental level. No evidence of pulmonary embolism. Normal heart size. No pericardial effusion. Mediastinum/Nodes: No enlarged mediastinal, hilar, or axillary lymph nodes. A stable, benign appearing 2.2 cm x 1.3 cm cystic structure is seen within the right lobe of the thyroid gland. The trachea and  esophagus demonstrate no significant findings. Lungs/Pleura: Lungs are clear. No pleural effusion or pneumothorax. Upper Abdomen: Surgical clips are seen within the gallbladder fossa. Musculoskeletal: No chest wall abnormality. No acute or significant osseous findings. Review of the MIP images confirms the above findings. IMPRESSION: No CT evidence of pulmonary embolism or acute cardiopulmonary disease. Electronically Signed   By: Virgina Norfolk M.D.   On: 02/19/2021 22:48    Procedures Procedures    Medications Ordered in ED Medications  iohexol (OMNIPAQUE) 350 MG/ML injection 80 mL (80 mLs Intravenous Contrast Given 02/19/21 2219)    ED Course/ Medical Decision Making/ A&P                           Medical Decision Making Amount and/or Complexity of Data Reviewed Labs: ordered. Radiology: ordered.  Risk Prescription drug  management.   61 year old lady with notable history of recent PE presenting to the emergency room with concern for new episodes of shortness of breath.  On exam she appears well in no distress and has normal vital signs.  Lungs are clear.  Reviewed basic lab work, grossly normal, no anemia or electrolyte derangement.  EKG without acute ischemic change, troponin within normal limits, doubt ACS.  Given her relatively recent PE diagnosis, concern for possible recurrent PE.  CTA chest obtained, independently reviewed and agree with radiology report, no evidence for pulmonary embolism.  Furthermore no evidence for pneumonia or other acute cardiopulmonary process.  On reassessment, patient reports that she is asymptomatic.  Her vital signs remain normal.  Given the very reassuring work-up thus far today and her reassuring clinical appearance, I feel she can be discharged and managed in the outpatient setting.  Does not require admission today.  Reviewed return precautions, advised follow-up with pulmonology and her primary care doctor.  After the discussed management above,  the patient was determined to be safe for discharge.  The patient was in agreement with this plan and all questions regarding their care were answered.  ED return precautions were discussed and the patient will return to the ED with any significant worsening of condition.         Final Clinical Impression(s) / ED Diagnoses Final diagnoses:  Dyspnea, unspecified type    Rx / DC Orders ED Discharge Orders     None         Lucrezia Starch, MD 02/21/21 [phone]

## 2021-02-19 NOTE — ED Notes (Signed)
Patient discharged to home.  All discharge instructions reviewed.  Patient verbalized understanding via teachback method.  VS WDL.  Respirations even and unlabored.  Ambulatory out of ED.   °

## 2021-02-19 NOTE — Discharge Instructions (Signed)
Please follow-up with your primary care doctor and your pulmonology specialist.  Come back to ER if you develop chest pain, difficulty breathing or other new concerning symptom.  Continue your previously prescribed medications.

## 2021-02-19 NOTE — ED Triage Notes (Signed)
Pt states SOB came in last time and had a PE.

## 2021-02-19 NOTE — ED Notes (Signed)
Patient transported to CT 

## 2021-02-22 ENCOUNTER — Telehealth: Payer: Self-pay | Admitting: General Practice

## 2021-02-22 NOTE — Telephone Encounter (Signed)
Transition Care Management Follow-up Telephone Call Date of discharge and from where: 02/19/21 [facility] How have you been since you were released from the hospital? Doing pretty well.  Any questions or concerns? No  Items Reviewed: Did the pt receive and understand the discharge instructions provided? Yes  Medications obtained and verified? Yes  Other? No  Any new allergies since your discharge? No  Dietary orders reviewed? Yes Do you have support at home? Yes   Home Care and Equipment/Supplies: Were home health services ordered? no  Functional Questionnaire: (I = Independent and D = Dependent) ADLs: I  Bathing/Dressing- I  Meal Prep- I  Eating- I  Maintaining continence- I  Transferring/Ambulation- I  Managing Meds- I  Follow up appointments reviewed:  PCP Hospital f/u appt confirmed? Yes  Scheduled to see Samuel Bouche, NP on 02/26/21 @ 1600. [facility] f/u appt confirmed? No   Are transportation arrangements needed? No  If their condition worsens, is the pt aware to call PCP or go to the Emergency Dept.? Yes Was the patient provided with contact information for the PCP's office or ED? Yes Was to pt encouraged to call back with questions or concerns? Yes

## 2021-02-25 ENCOUNTER — Other Ambulatory Visit: Payer: Self-pay

## 2021-02-25 DIAGNOSIS — I10 Essential (primary) hypertension: Secondary | ICD-10-CM | POA: Diagnosis not present

## 2021-02-26 ENCOUNTER — Other Ambulatory Visit: Payer: Self-pay

## 2021-02-26 ENCOUNTER — Ambulatory Visit (INDEPENDENT_AMBULATORY_CARE_PROVIDER_SITE_OTHER): Payer: BC Managed Care – PPO | Admitting: Medical-Surgical

## 2021-02-26 ENCOUNTER — Encounter: Payer: Self-pay | Admitting: Medical-Surgical

## 2021-02-26 VITALS — BP 130/88 | HR 81 | Resp 20 | Ht 66.0 in | Wt 283.0 lb

## 2021-02-26 DIAGNOSIS — D509 Iron deficiency anemia, unspecified: Secondary | ICD-10-CM | POA: Diagnosis not present

## 2021-02-26 DIAGNOSIS — F5104 Psychophysiologic insomnia: Secondary | ICD-10-CM | POA: Diagnosis not present

## 2021-02-26 DIAGNOSIS — F419 Anxiety disorder, unspecified: Secondary | ICD-10-CM

## 2021-02-26 DIAGNOSIS — J410 Simple chronic bronchitis: Secondary | ICD-10-CM

## 2021-02-26 DIAGNOSIS — Z09 Encounter for follow-up examination after completed treatment for conditions other than malignant neoplasm: Secondary | ICD-10-CM | POA: Diagnosis not present

## 2021-02-26 LAB — BASIC METABOLIC PANEL
BUN/Creatinine Ratio: 11 — ABNORMAL LOW (ref 12–28)
BUN: 11 mg/dL (ref 8–27)
CO2: 23 mmol/L (ref 20–29)
Calcium: 9.3 mg/dL (ref 8.7–10.3)
Chloride: 107 mmol/L — ABNORMAL HIGH (ref 96–106)
Creatinine, Ser: 0.97 mg/dL (ref 0.57–1.00)
Glucose: 106 mg/dL — ABNORMAL HIGH (ref 70–99)
Potassium: 4.2 mmol/L (ref 3.5–5.2)
Sodium: 145 mmol/L — ABNORMAL HIGH (ref 134–144)
eGFR: 66 mL/min/{1.73_m2} (ref >59)

## 2021-02-26 MED ORDER — TRAZODONE HCL 100 MG PO TABS: 100.0000 mg | ORAL_TABLET | Freq: Every day | ORAL | 1 refills | Status: DC

## 2021-02-26 NOTE — Progress Notes (Signed)
°  HPI with pertinent ROS:   CC: Hospital follow-up  HPI: Pleasant 61 year old female presenting today for hospital discharge follow-up.  She was evaluated in the emergency room on 02/19/2021 for shortness of breath.  She notes she had been short of breath for 3 days and it was not resolving so she went to the ED for evaluation.  Her work-up there was negative and they found no significant concerns to explain her symptoms.  Notes that in the past, she had been taking Spiriva for what was felt to be COPD.  Her most recent pulmonologist took her off of Spiriva and switched her to [facility] which she did not tolerate.  She also has not tolerated the albuterol and Hailer used for rescue because it causes nausea and makes her feel awful.  She continues to feel shortness of breath with activity.  When she was exercising, her apple watch noted that her pulse ox dropped to 83%.  She is exercising at least 30 minutes daily although this has been more difficult lately.  Her hemoglobin was checked at the hospital showing what appears to be iron deficiency anemia.  She does have a history of this and thus not currently taking an iron supplement.  Wonders what iron replacement is safe while she is using Xarelto.  Has been taking trazodone 50 mg nightly and notes that it appears to be helping some.  She did try stopping it when she thought it did not work and promptly had issues with panic attacks.  She has since restarted it but notes that it could definitely work better.  I reviewed the past medical history, family history, social history, surgical history, and allergies today and no changes were needed.  Please see the problem list section below in epic for further details.   Physical exam:   General: Well Developed, well nourished, and in no acute distress.  Neuro: Alert and oriented x3.  HEENT: Normocephalic, atraumatic.  Skin: Warm and dry. Cardiac: Regular rate and rhythm, no murmurs rubs or gallops, no  lower extremity edema.  Respiratory: Clear to auscultation bilaterally. Not using accessory muscles, speaking in full sentences.  Impression and Recommendations:    1. Hospital discharge follow-up Reviewed available information and discussed healthcare concerns with patient.  Reviewed hospitalization records.  No concerning symptoms to explain shortness of breath.  Feel this is likely combination of anxiety, obesity, deconditioning, and continued recovery from pulmonary embolism.  2. Psychophysiological insomnia 3. Anxiety Increasing trazodone to 100 mg nightly.  4. Iron deficiency anemia, unspecified iron deficiency anemia type Recommend starting a multivitamin with iron.  Reviewed various options available over-the-counter.  5. Simple chronic bronchitis ([place]) She felt best when she was on Spiriva so restarting this today.  She has a month and a half of the supply at home so we will not send a new prescription and till she is ready.  She will let me know when to send that.  Return for Follow-up as scheduled in April. ___________________________________________ Clearnce Sorrel, DNP, APRN, FNP-BC [facility] [place]

## 2021-03-02 ENCOUNTER — Telehealth: Payer: Self-pay

## 2021-03-02 DIAGNOSIS — I10 Essential (primary) hypertension: Secondary | ICD-10-CM

## 2021-03-02 MED ORDER — METOPROLOL SUCCINATE ER 50 MG PO TB24: 50.0000 mg | ORAL_TABLET | Freq: Every day | ORAL | 1 refills | Status: DC

## 2021-03-02 NOTE — Telephone Encounter (Signed)
-----   Message from Park Liter, MD sent at 02/26/2021 10:48 AM EST ----- Labs are looking good, increase metoprolol to 50 mg daily.

## 2021-03-02 NOTE — Telephone Encounter (Signed)
Per Dr. Agustin Cree repeat labs in a month. Patient notified. Orders on file.

## 2021-03-28 ENCOUNTER — Encounter: Payer: Self-pay | Admitting: Cardiology

## 2021-03-29 ENCOUNTER — Ambulatory Visit: Payer: BC Managed Care – PPO | Admitting: Nurse Practitioner

## 2021-03-29 MED ORDER — VALSARTAN 160 MG PO TABS: 160.0000 mg | ORAL_TABLET | Freq: Every day | ORAL | 2 refills | Status: DC

## 2021-03-30 ENCOUNTER — Other Ambulatory Visit: Payer: Self-pay

## 2021-03-30 ENCOUNTER — Ambulatory Visit (INDEPENDENT_AMBULATORY_CARE_PROVIDER_SITE_OTHER): Payer: BC Managed Care – PPO | Admitting: Nurse Practitioner

## 2021-03-30 ENCOUNTER — Ambulatory Visit (INDEPENDENT_AMBULATORY_CARE_PROVIDER_SITE_OTHER): Payer: BC Managed Care – PPO

## 2021-03-30 ENCOUNTER — Encounter: Payer: Self-pay | Admitting: Nurse Practitioner

## 2021-03-30 VITALS — BP 138/88 | HR 83 | Temp 98.6°F | Ht 66.0 in | Wt 282.6 lb

## 2021-03-30 DIAGNOSIS — J302 Other seasonal allergic rhinitis: Secondary | ICD-10-CM | POA: Diagnosis not present

## 2021-03-30 DIAGNOSIS — R058 Other specified cough: Secondary | ICD-10-CM

## 2021-03-30 DIAGNOSIS — J4520 Mild intermittent asthma, uncomplicated: Secondary | ICD-10-CM | POA: Diagnosis not present

## 2021-03-30 DIAGNOSIS — J309 Allergic rhinitis, unspecified: Secondary | ICD-10-CM | POA: Insufficient documentation

## 2021-03-30 DIAGNOSIS — R059 Cough, unspecified: Secondary | ICD-10-CM | POA: Diagnosis not present

## 2021-03-30 HISTORY — DX: Other specified cough: R05.8

## 2021-03-30 HISTORY — DX: Allergic rhinitis, unspecified: J30.9

## 2021-03-30 MED ORDER — FLUTICASONE PROPIONATE 50 MCG/ACT NA SUSP: 2.0000 | Freq: Every day | NASAL | 2 refills | Status: DC

## 2021-03-30 MED ORDER — FEXOFENADINE HCL 180 MG PO TABS: 180.0000 mg | ORAL_TABLET | Freq: Every day | ORAL | 2 refills | Status: DC

## 2021-03-30 MED ORDER — VALSARTAN 160 MG PO TABS: 160.0000 mg | ORAL_TABLET | Freq: Every day | ORAL | 2 refills | Status: DC

## 2021-03-30 NOTE — Assessment & Plan Note (Addendum)
Advised to avoid clearing throat and suck on SF candies with frequent sips of water. Cough suppression measures and postnasal drainage control. No report of GERD symptoms; if problems persist, can consider addition of PPI. Suspect Spiriva DPI was exacerbating upper airway irritation given her worsening cough with use. Recent PFTs showed no obstruction or BD response; advised her to stop Spiriva and notify if she has worsening in her breathing. Continue albuterol PRN. CXR with clear lungs today. See above plan.

## 2021-03-30 NOTE — Assessment & Plan Note (Addendum)
Hx of asthma. Recent PFTs without obstruction or BD. No evidence of bronchospasm on exam. Doesn't use albuterol inhaler. Suspect cough mostly upper airway irritation given significant nasal symptoms. Spiriva was previously stopped by Dr. Lamonte Sakai, DPI restarted by PCP d/t cough. Advised pt to stop today. If SOB worsens, can consider trial of ICS/LABA HFA as this would be more appropriate for asthma therapy. Continue Albuterol PRN.

## 2021-03-30 NOTE — Assessment & Plan Note (Signed)
Suspect her symptoms are related to poorly controlled allergic rhinitis and upper airway cough syndrome. Previously did not notice much difference with OTC antihistamines. Will trial Allegra. Initiate therapy with trigger prevention -postnasal drainage and cough control. If symptoms persist, CT sinus and refer to ENT.   Patient Instructions  Stop Spiriva Continue Albuterol inhaler 2 puffs every 6 hours as needed for shortness of breath or wheezing.  Restart Astelin nasal spray 2 sprays each nostril Twice daily  Continue Xarelto 20 mg with supper daily. Notify or seek care if excessive bleeding/bruising occurs Continue acetaminophen 650 mg every 6 hours as needed for headaches   Start flonase 2 sprays each nostril daily for nasal congestion Start Allegra 180 mg daily for allergies Delsym 2 tsp Twice daily for cough Mucinex 600 mg Twice daily for congestion/cough Chlortab 4 mg At bedtime for cough  Saline nasal irrigations 1-2 times a day  Chest x ray today. We Will notify you of any abnormal results.   Follow up in one month with Dr. Lamonte Sakai or Alanson Aly If symptoms do not improve or worsen, please contact office for sooner follow up or seek emergency care.

## 2021-03-30 NOTE — Addendum Note (Signed)
Addended by: Truddie Hidden on: 03/30/2021 05:44 PM   Modules accepted: Orders

## 2021-03-30 NOTE — Patient Instructions (Addendum)
Stop Spiriva Continue Albuterol inhaler 2 puffs every 6 hours as needed for shortness of breath or wheezing.  Restart Astelin nasal spray 2 sprays each nostril Twice daily  Continue Xarelto 20 mg with supper daily. Notify or seek care if excessive bleeding/bruising occurs Continue acetaminophen 650 mg every 6 hours as needed for headaches   Start flonase 2 sprays each nostril daily for nasal congestion Start Allegra 180 mg daily for allergies Delsym 2 tsp Twice daily for cough Mucinex 600 mg Twice daily for congestion/cough Chlortab 4 mg At bedtime for cough  Saline nasal irrigations 1-2 times a day  Chest x ray today. We Will notify you of any abnormal results.   Follow up in one month with Dr. Lamonte Sakai or Alanson Aly If symptoms do not improve or worsen, please contact office for sooner follow up or seek emergency care.

## 2021-03-30 NOTE — Progress Notes (Signed)
@Patient  ID: Alyssa [name], female    DOB: [birth year], 61 y.o.   MRN: [id number]  Chief Complaint  Patient presents with   Follow-up    Cough non productive    Referring provider: Samuel Bouche, NP  HPI: 61 year old female, never smoker followed for asthma with chronic bronchitis, lung nodule and pulmonary embolism. She is a patient of Dr. Agustina Caroli and last seen in office on 01/15/2021. Past medical history significant for hx of DVT, HTN, GERD, osteoarthritis, anxiety, HLD, IDA, obesity, vitamin D deficiency, sickle cell trait.  TEST/EVENTS:  01/14/2021 echocardiogram: EF 60-65%, mild LVH, GIIDD, RV function and size returned to normal 01/15/2021 PFTs: FVC 2.36 (82), FEV1 2.04 (90), ratio 87, TLC 72%, DLCOcor 73%. Possible mild restrictive lung disease without BD and mild diffusion defect 02/19/2021 CTA chest: no evidence of PE. Stable, benign appearing 2.2 cmx1.3cm cystic structure in right lobe of thyroid. Lungs clear without acute process.   01/15/2021: OV with Dr. Lamonte Sakai. Evaluated in October '22 for acute SOB - found to have RLE DVT and PE and started on Xarelto. Suspected to be provoked given immobilization and plan to continue for at least 6 months. Seen in November in office and tx for influenza. This OV, improved and completed PFTs which showed a mild possible restriction without BD and no evidence of obstruction. No maintenance inhaler; continued PRN albuterol.   03/30/2021: Today - acute visit Patient presents today for reported URI symptoms over the past week. She feels as though it is a flare in her allergies. She started with headaches about a week ago. She has been having some sinus pressure/congestion as well. Her cough has returned and is primarily non-productive. She has been having issues on and off with her cough and her PCP restarted had her restart her Spiriva DPI, which she feels hasn't helped. She has had some mild shortness of breath which quickly resolves with rest. No  significant rhinorrhea or purulent drainage. She denies fevers, recent sick exposures, wheezing, orthopnea, PND or leg swelling. She is on Xarelto for PE and DVT and denies any excessive bleeding bruising. She has been taking tylenol sinus but feels like it hasn't helped much. She has tried over the counter allergy medicines in the past without much relief including Claritin and Xyzal. She rarely uses her rescue inhaler.   Allergies  Allergen Reactions   Sulfa Antibiotics Hives   Albuterol Sulfate Cough and Nausea And Vomiting   Lactose Intolerance (Gi)     Immunization History  Administered Date(s) Administered   Influenza Split 12/28/2012   Influenza,inj,quad, With Preservative 10/16/2015   Influenza-Unspecified 10/16/2015   PFIZER(Purple Top)SARS-COV-2 Vaccination 03/28/2019, 04/25/2019, 12/24/2019, 09/02/2020   PPD Test 10/14/2011   Pneumococcal Polysaccharide-23 08/28/2014   Tdap 11/26/2008    Past Medical History:  Diagnosis Date   Anxiety 05/2018   Arthritis    Asthma    Costochondritis    DVT (deep venous thrombosis) (HCC)    GERD (gastroesophageal reflux disease)    Hypertension    Pulmonary emboli (HCC)     Tobacco History: Social History   Tobacco Use  Smoking Status Never  Smokeless Tobacco Never  Tobacco Comments   Exposure to second hand smoke   Counseling given: Not Answered Tobacco comments: Exposure to second hand smoke   Outpatient Medications Prior to Visit  Medication Sig Dispense Refill   metoprolol succinate (TOPROL-XL) 50 MG 24 hr tablet Take 1 tablet (50 mg total) by mouth daily. Take with or immediately following  a meal. 30 tablet 1   rivaroxaban (XARELTO) 20 MG TABS tablet Take 1 tablet (20 mg total) by mouth daily with supper. 90 tablet 1   traZODone (DESYREL) 100 MG tablet Take 1 tablet (100 mg total) by mouth at bedtime. 90 tablet 1   valsartan (DIOVAN) 160 MG tablet Take 1 tablet (160 mg total) by mouth daily. 90 tablet 2   tiotropium  (SPIRIVA) 18 MCG inhalation capsule Place 18 mcg into inhaler and inhale daily.     acetaminophen (TYLENOL) 325 MG tablet Take 2 tablets (650 mg total) by mouth every 6 (six) hours as needed for mild pain or headache. (Patient not taking: Reported on 03/30/2021)     Azelastine HCl 137 MCG/SPRAY SOLN Place 1 spray into both nostrils 2 (two) times daily as needed (nasal congestion). (Patient not taking: Reported on 03/30/2021)     No facility-administered medications prior to visit.     Review of Systems:   Constitutional: No weight loss or gain, night sweats, fevers, chills, fatigue, or lassitude. HEENT: No difficulty swallowing, tooth/dental problems, or sore throat. No sneezing, itching, ear ache. +headaches, nasal congestion/sinus pressure, occasional clear rhinorrhea CV:  No chest pain, orthopnea, PND, swelling in lower extremities, anasarca, dizziness, palpitations, syncope Resp: +shortness of breath with coughing spells, minimal; dry cough. No excess mucus or change in color of mucus. No hemoptysis. No wheezing.  No chest wall deformity GI:  No heartburn, indigestion, abdominal pain, nausea, vomiting, diarrhea, change in bowel habits, loss of appetite, bloody stools.  GU: No dysuria, change in color of urine, urgency or frequency.  No flank pain, no hematuria  Skin: No rash, lesions, ulcerations MSK:  No joint pain or swelling.  No decreased range of motion.  No back pain. Neuro: No dizziness or lightheadedness.  Psych: No depression or anxiety. Mood stable.     Physical Exam:  BP 138/88 (BP Location: Right Arm, Cuff Size: Normal)    Pulse 83    Temp 98.6 F (37 C) (Oral)    Ht 5\' 6"  (1.676 m)    Wt 282 lb 9.6 oz (128.2 kg)    SpO2 97%    BMI 45.61 kg/m   GEN: Pleasant, interactive, well-appearing; morbidly obese; in no acute distress. HEENT:  Normocephalic and atraumatic. EACs patent bilaterally. TM pearly gray with present light reflex bilaterally. PERRLA. Sclera white. Nasal  turbinates erythematous, boggy, patent bilaterally. Clear rhinorrhea present. Oropharynx erythematous and moist, without exudate or edema. No lesions, ulcerations NECK:  Supple w/ fair ROM. No JVD present. Normal carotid impulses w/o bruits. Thyroid symmetrical with no goiter or nodules palpated. No lymphadenopathy.   CV: RRR, no m/r/g, no peripheral edema. Pulses intact, +2 bilaterally. No cyanosis, pallor or clubbing. PULMONARY:  Unlabored, regular breathing. Clear bilaterally A&P w/o wheezes/rales/rhonchi. No accessory muscle use. No dullness to percussion. GI: BS present and normoactive. Soft, non-tender to palpation. No organomegaly or masses detected. No CVA tenderness. MSK: No erythema, warmth or tenderness. Cap refil <2 sec all extrem. No deformities or joint swelling noted.  Neuro: A/Ox3. No focal deficits noted.   Skin: Warm, no lesions or rashe Psych: Normal affect and behavior. Judgement and thought content appropriate.     Lab Results:  CBC    Component Value Date/Time   WBC 5.4 02/19/2021 2034   RBC 4.77 02/19/2021 2034   HGB 11.8 (L) 02/19/2021 2034   HCT 37.1 02/19/2021 2034   PLT 268 02/19/2021 2034   MCV 77.8 (L) 02/19/2021 2034  MCH 24.7 (L) 02/19/2021 2034   MCHC 31.8 02/19/2021 2034   RDW 15.9 (H) 02/19/2021 2034   LYMPHSABS 1.5 02/19/2021 2034   MONOABS 0.4 02/19/2021 2034   EOSABS 0.1 02/19/2021 2034   BASOSABS 0.0 02/19/2021 2034    BMET    Component Value Date/Time   NA 145 (H) 02/25/2021 1133   K 4.2 02/25/2021 1133   CL 107 (H) 02/25/2021 1133   CO2 23 02/25/2021 1133   GLUCOSE 106 (H) 02/25/2021 1133   GLUCOSE 96 02/19/2021 2034   BUN 11 02/25/2021 1133   CREATININE 0.97 02/25/2021 1133   CALCIUM 9.3 02/25/2021 1133   GFRNONAA >60 02/19/2021 2034    BNP    Component Value Date/Time   BNP 27.6 02/19/2021 2034     Imaging:  DG Chest 2 View  Result Date: 03/30/2021 CLINICAL DATA:  Cough. EXAM: CHEST - 2 VIEW COMPARISON:  Chest  radiographs and CTA 02/19/2021 FINDINGS: The cardiomediastinal silhouette is unchanged with normal heart size. No airspace consolidation, edema, pleural effusion, or pneumothorax is identified. No acute osseous abnormality is seen. IMPRESSION: No active cardiopulmonary disease. Electronically Signed   By: Logan Bores M.D.   On: 03/30/2021 11:29      PFT Results Latest Ref Rng & Units 01/15/2021  FVC-Pre L 2.36  FVC-Predicted Pre % 82  FVC-Post L 2.27  FVC-Predicted Post % 79  Pre FEV1/FVC % % 86  Post FEV1/FCV % % 87  FEV1-Pre L 2.04  FEV1-Predicted Pre % 90  FEV1-Post L 1.97  DLCO uncorrected ml/min/mmHg 15.47  DLCO UNC% % 71  DLCO corrected ml/min/mmHg 15.77  DLCO COR %Predicted % 73  DLVA Predicted % 123  TLC L 3.86  TLC % Predicted % 72  RV % Predicted % 75    No results found for: NITRICOXIDE      Assessment & Plan:   Allergic rhinitis Suspect her symptoms are related to poorly controlled allergic rhinitis and upper airway cough syndrome. Previously did not notice much difference with OTC antihistamines. Will trial Allegra. Initiate therapy with trigger prevention -postnasal drainage and cough control. If symptoms persist, CT sinus and refer to ENT.   Patient Instructions  Stop Spiriva Continue Albuterol inhaler 2 puffs every 6 hours as needed for shortness of breath or wheezing.  Restart Astelin nasal spray 2 sprays each nostril Twice daily  Continue Xarelto 20 mg with supper daily. Notify or seek care if excessive bleeding/bruising occurs Continue acetaminophen 650 mg every 6 hours as needed for headaches   Start flonase 2 sprays each nostril daily for nasal congestion Start Allegra 180 mg daily for allergies Delsym 2 tsp Twice daily for cough Mucinex 600 mg Twice daily for congestion/cough Chlortab 4 mg At bedtime for cough  Saline nasal irrigations 1-2 times a day  Chest x ray today. We Will notify you of any abnormal results.   Follow up in one month with  Dr. Lamonte Sakai or Alanson Aly If symptoms do not improve or worsen, please contact office for sooner follow up or seek emergency care.    Upper airway cough syndrome Advised to avoid clearing throat and suck on SF candies with frequent sips of water. Cough suppression measures and postnasal drainage control. No report of GERD symptoms; if problems persist, can consider addition of PPI. Suspect Spiriva DPI was exacerbating upper airway irritation given her worsening cough with use. Recent PFTs showed no obstruction or BD response; advised her to stop Spiriva and notify if she has worsening  in her breathing. Continue albuterol PRN. CXR with clear lungs today. See above plan.  Asthma Hx of asthma. Recent PFTs without obstruction or BD. No evidence of bronchospasm on exam. Doesn't use albuterol inhaler. Suspect cough mostly upper airway irritation given significant nasal symptoms. Spiriva was previously stopped by Dr. Lamonte Sakai, DPI restarted by PCP d/t cough. Advised pt to stop today. If SOB worsens, can consider trial of ICS/LABA HFA as this would be more appropriate for asthma therapy. Continue Albuterol PRN.   I spent 36 minutes of dedicated to the care of this patient on the date of this encounter to include pre-visit review of records, face-to-face time with the patient discussing conditions above, post visit ordering of testing, clinical documentation with the electronic health record, making appropriate referrals as documented, and communicating necessary findings to members of the patients care team.  Clayton Bibles, NP 03/30/2021  Pt aware and understands NP's role.

## 2021-03-30 NOTE — Progress Notes (Signed)
Please notify pt CXR without any evidence of superimpose infection or bronchitis. Continue with our plan as discussed earlier. Thanks!

## 2021-04-29 ENCOUNTER — Ambulatory Visit (INDEPENDENT_AMBULATORY_CARE_PROVIDER_SITE_OTHER): Payer: BC Managed Care – PPO | Admitting: Emergency Medicine

## 2021-04-29 ENCOUNTER — Ambulatory Visit: Payer: BC Managed Care – PPO | Admitting: Medical-Surgical

## 2021-04-29 ENCOUNTER — Encounter: Payer: Self-pay | Admitting: Emergency Medicine

## 2021-04-29 DIAGNOSIS — Z7901 Long term (current) use of anticoagulants: Secondary | ICD-10-CM | POA: Diagnosis not present

## 2021-04-29 DIAGNOSIS — J4520 Mild intermittent asthma, uncomplicated: Secondary | ICD-10-CM

## 2021-04-29 DIAGNOSIS — J302 Other seasonal allergic rhinitis: Secondary | ICD-10-CM

## 2021-04-29 DIAGNOSIS — I2699 Other pulmonary embolism without acute cor pulmonale: Secondary | ICD-10-CM

## 2021-04-29 MED ORDER — APIXABAN 2.5 MG PO TABS: 2.5000 mg | ORAL_TABLET | Freq: Two times a day (BID) | ORAL | 11 refills | Status: DC

## 2021-04-29 NOTE — Assessment & Plan Note (Signed)
She was having increased cough that does seem to have been related to flaring allergic rhinitis.  She benefited significantly from the [place].  She still has Astelin nasal spray that she can use if needed.  We will refill the Allegra for her today and continue it at least through the allergy season.  Depending on symptoms could consider extending year-round ?

## 2021-04-29 NOTE — Progress Notes (Signed)
? ?Subjective:  ? ? Patient ID: Alyssa [name], female    DOB: Aug 01, 1960, 61 y.o.   MRN: [id number] ? ?HPI ? ?ROV 01/15/21 --61 year old woman with arthritis, hypertension, bilateral pulmonary emboli diagnosed in 10/2020 with a right lower extremity DVT for which she was started on anticoagulation.  Also carries a history of asthma.  Transiently on Spiriva after her hospitalization for PE but now discontinued.  ?Since I saw her she had influenza in November, caused an apparent COPD/asthma flare for which she was treated with Tamiflu, prednisone.  She kept a residual cough that caused chest discomfort.  She improved, no cough. Still some congestion. Never needs albuterol. Remains on xarelto. She needs a spacer.  ? ?Pulmonary function testing performed today and reviewed by me shows evidence for possible restriction (mild) on spirometry without a bronchodilator response.  No clear evidence for obstruction.  Lung volumes are mildly restricted.  Diffusion capacity is decreased and corrects to the normal range when adjusted for alveolar volume. ? ?ROV 04/29/21 --follow-up visit for 61 year old woman with a history of VTE (DVT and pulmonary embolism 10/2020).  She has longstanding history of asthma although her pulmonary function testing does not support obstruction.  She has been seen for dyspnea and chronic cough.  She had flaring cough symptoms 1 month ago and was started on Allegra, restarted Astelin nasal spray.  She had been started on Spiriva which was stopped. ?Today she reports that she is doing better. Her cough is improved, rhinitis improved. Never uses albuterol. She does still have exertional SOB, improved from October PE. She has a family hx of PE.  ? ? ?Review of Systems ?As per HPI ? ? ?   ?Objective:  ? Physical Exam ?Vitals:  ? 04/29/21 1149  ?BP: 126/74  ?Pulse: 87  ?Temp: 98.1 ?F (36.7 ?C)  ?TempSrc: Oral  ?SpO2: 97%  ?Weight: 282 lb (127.9 kg)  ?Height: '5\' 6"'$  (1.676 m)  ? ?Gen: Pleasant, obese  woman, in no distress,  normal affect ? ?ENT: No lesions,  mouth clear,  oropharynx clear, no postnasal drip ? ?Neck: No JVD, no stridor ? ?Lungs: No use of accessory muscles, decreased at both bases, no crackles or wheezing on normal respiration, no wheeze on forced expiration ? ?Cardiovascular: RRR, heart sounds normal, no murmur or gallops, no peripheral edema ? ?Musculoskeletal: No deformities, no cyanosis or clubbing ? ?Neuro: alert, awake, non focal ? ?Skin: Warm, no lesions or rash ? ? ?   ?Assessment & Plan:  ? ?Pulmonary embolism on long-term anticoagulation therapy ([place]) ?Completed just under 6 months of therapeutic Xarelto.  Discussed the pros and cons of stopping anticoagulation and following versus continuing therapeutic dosing.  Other alternative would be to continue half dose prophylactic dose Eliquis.  She would like to do the latter.  I will make that change today. ? ?Mild intermittent asthma without complication ?Her pulmonary function testing does not support obstruction.  She may have intermittent mild obstructive symptoms.  She has albuterol available to use if needed.  She does not need to be on maintenance ICS or bronchodilator at this time. ? ?Allergic rhinitis ?She was having increased cough that does seem to have been related to flaring allergic rhinitis.  She benefited significantly from the [place].  She still has Astelin nasal spray that she can use if needed.  We will refill the Allegra for her today and continue it at least through the allergy season.  Depending on symptoms could consider extending year-round ? ? ?  Baltazar Apo, MD, PhD ?04/29/2021, 2:05 PM ?[facility] ?[phone] or if no answer before 7:00PM call [phone] ?For any issues after 7:00PM please call eLink [phone] ? ?

## 2021-04-29 NOTE — Assessment & Plan Note (Signed)
Her pulmonary function testing does not support obstruction.  She may have intermittent mild obstructive symptoms.  She has albuterol available to use if needed.  She does not need to be on maintenance ICS or bronchodilator at this time. ?

## 2021-04-29 NOTE — Assessment & Plan Note (Signed)
Completed just under 6 months of therapeutic Xarelto.  Discussed the pros and cons of stopping anticoagulation and following versus continuing therapeutic dosing.  Other alternative would be to continue half dose prophylactic dose Eliquis.  She would like to do the latter.  I will make that change today. ?

## 2021-04-29 NOTE — Patient Instructions (Signed)
We will stop your Xarelto. ?Please start Eliquis 2.5 mg twice daily. ?We will refill your fexofenadine 180 mg once daily. ?Okay to keep albuterol available to use 2 puffs if needed for shortness of breath, chest tightness, wheezing. ?We will not restart your Spiriva ?Follow with Dr Lamonte Sakai in 6 months or sooner if you have any problems ? ?

## 2021-05-03 ENCOUNTER — Other Ambulatory Visit: Payer: Self-pay | Admitting: Medical-Surgical

## 2021-05-03 DIAGNOSIS — Z1231 Encounter for screening mammogram for malignant neoplasm of breast: Secondary | ICD-10-CM

## 2021-05-04 ENCOUNTER — Ambulatory Visit: Payer: BC Managed Care – PPO | Admitting: Emergency Medicine

## 2021-05-04 ENCOUNTER — Other Ambulatory Visit: Payer: Self-pay | Admitting: Cardiology

## 2021-05-05 DIAGNOSIS — Z1231 Encounter for screening mammogram for malignant neoplasm of breast: Secondary | ICD-10-CM

## 2021-05-09 NOTE — Patient Instructions (Signed)
   Complete physical exam  Patient: Alyssa [name]   DOB: 11/20/1999   61 y.o. Female  MRN: [id number]  Subjective:    No chief complaint on file.   Alyssa [name] is a 61 y.o. female who presents today for a complete physical exam. She reports consuming a {diet types:17450} diet. {types:19826} She generally feels {DESC; WELL/FAIRLY WELL/POORLY:18703}. She reports sleeping {DESC; WELL/FAIRLY WELL/POORLY:18703}. She {does/does not:200015} have additional problems to discuss today.    Most recent fall risk assessment:    07/28/2021   10:42 AM  Fall Risk   Falls in the past year? 0  Number falls in past yr: 0  Injury with Fall? 0  Risk for fall due to : No Fall Risks  Follow up Falls evaluation completed     Most recent depression screenings:    07/28/2021   10:42 AM 06/18/2020   10:46 AM  PHQ 2/9 Scores  PHQ - 2 Score 0 0  PHQ- 9 Score 5     {VISON DENTAL STD PSA (Optional):27386}  {History (Optional):23778}  Patient Care Team: Komal Stangelo, NP as PCP - General (Nurse Practitioner)   Outpatient Medications Prior to Visit  Medication Sig   fluticasone (FLONASE) 50 MCG/ACT nasal spray Place 2 sprays into both nostrils in the morning and at bedtime. After 7 days, reduce to once daily.   norgestimate-ethinyl estradiol (SPRINTEC 28) 0.25-35 MG-MCG tablet Take 1 tablet by mouth daily.   Nystatin POWD Apply liberally to affected area 2 times per day   spironolactone (ALDACTONE) 100 MG tablet Take 1 tablet (100 mg total) by mouth daily.   No facility-administered medications prior to visit.    ROS        Objective:     There were no vitals taken for this visit. {Vitals History (Optional):23777}  Physical Exam   No results found for any visits on 09/02/21. {Show previous labs (optional):23779}    Assessment & Plan:    Routine Health Maintenance and Physical Exam  Immunization History  Administered Date(s) Administered   DTaP 02/03/1999, 04/01/1999,  06/10/1999, 02/24/2000, 09/09/2003   Hepatitis A 07/06/2007, 07/11/2008   Hepatitis B 11/21/1998, 12/29/1998, 06/10/1999   HiB (PRP-OMP) 02/03/1999, 04/01/1999, 06/10/1999, 02/24/2000   IPV 02/03/1999, 04/01/1999, 11/29/1999, 09/09/2003   Influenza,inj,Quad PF,6+ Mos 10/11/2013   Influenza-Unspecified 01/11/2012   MMR 11/28/2000, 09/09/2003   Meningococcal Polysaccharide 07/11/2011   Pneumococcal Conjugate-13 02/24/2000   Pneumococcal-Unspecified 06/10/1999, 08/24/1999   Tdap 07/11/2011   Varicella 11/29/1999, 07/06/2007    Health Maintenance  Topic Date Due   HIV Screening  Never done   Hepatitis C Screening  Never done   INFLUENZA VACCINE  08/31/2021   PAP-Cervical Cytology Screening  09/02/2021 (Originally 11/20/2019)   PAP SMEAR-Modifier  09/02/2021 (Originally 11/20/2019)   TETANUS/TDAP  09/02/2021 (Originally 07/10/2021)   HPV VACCINES  Discontinued   COVID-19 Vaccine  Discontinued    Discussed health benefits of physical activity, and encouraged her to engage in regular exercise appropriate for her age and condition.  Problem List Items Addressed This Visit   None Visit Diagnoses     Annual physical exam    -  Primary   Cervical cancer screening       Need for Tdap vaccination          No follow-ups on file.     Thanvi Blincoe, NP   

## 2021-05-09 NOTE — Progress Notes (Signed)
   Complete physical exam  Patient: Alyssa [name]   DOB: 11/20/1999   61 y.o. Female  MRN: [id number]  Subjective:    No chief complaint on file.   Alyssa [name] is a 61 y.o. female who presents today for a complete physical exam. She reports consuming a {diet types:17450} diet. {types:19826} She generally feels {DESC; WELL/FAIRLY WELL/POORLY:18703}. She reports sleeping {DESC; WELL/FAIRLY WELL/POORLY:18703}. She {does/does not:200015} have additional problems to discuss today.    Most recent fall risk assessment:    07/28/2021   10:42 AM  Fall Risk   Falls in the past year? 0  Number falls in past yr: 0  Injury with Fall? 0  Risk for fall due to : No Fall Risks  Follow up Falls evaluation completed     Most recent depression screenings:    07/28/2021   10:42 AM 06/18/2020   10:46 AM  PHQ 2/9 Scores  PHQ - 2 Score 0 0  PHQ- 9 Score 5     {VISON DENTAL STD PSA (Optional):27386}  {History (Optional):23778}  Patient Care Team: Shonique Pelphrey, NP as PCP - General (Nurse Practitioner)   Outpatient Medications Prior to Visit  Medication Sig   fluticasone (FLONASE) 50 MCG/ACT nasal spray Place 2 sprays into both nostrils in the morning and at bedtime. After 7 days, reduce to once daily.   norgestimate-ethinyl estradiol (SPRINTEC 28) 0.25-35 MG-MCG tablet Take 1 tablet by mouth daily.   Nystatin POWD Apply liberally to affected area 2 times per day   spironolactone (ALDACTONE) 100 MG tablet Take 1 tablet (100 mg total) by mouth daily.   No facility-administered medications prior to visit.    ROS        Objective:     There were no vitals taken for this visit. {Vitals History (Optional):23777}  Physical Exam   No results found for any visits on 09/02/21. {Show previous labs (optional):23779}    Assessment & Plan:    Routine Health Maintenance and Physical Exam  Immunization History  Administered Date(s) Administered   DTaP 02/03/1999, 04/01/1999,  06/10/1999, 02/24/2000, 09/09/2003   Hepatitis A 07/06/2007, 07/11/2008   Hepatitis B 11/21/1998, 12/29/1998, 06/10/1999   HiB (PRP-OMP) 02/03/1999, 04/01/1999, 06/10/1999, 02/24/2000   IPV 02/03/1999, 04/01/1999, 11/29/1999, 09/09/2003   Influenza,inj,Quad PF,6+ Mos 10/11/2013   Influenza-Unspecified 01/11/2012   MMR 11/28/2000, 09/09/2003   Meningococcal Polysaccharide 07/11/2011   Pneumococcal Conjugate-13 02/24/2000   Pneumococcal-Unspecified 06/10/1999, 08/24/1999   Tdap 07/11/2011   Varicella 11/29/1999, 07/06/2007    Health Maintenance  Topic Date Due   HIV Screening  Never done   Hepatitis C Screening  Never done   INFLUENZA VACCINE  08/31/2021   PAP-Cervical Cytology Screening  09/02/2021 (Originally 11/20/2019)   PAP SMEAR-Modifier  09/02/2021 (Originally 11/20/2019)   TETANUS/TDAP  09/02/2021 (Originally 07/10/2021)   HPV VACCINES  Discontinued   COVID-19 Vaccine  Discontinued    Discussed health benefits of physical activity, and encouraged her to engage in regular exercise appropriate for her age and condition.  Problem List Items Addressed This Visit   None Visit Diagnoses     Annual physical exam    -  Primary   Cervical cancer screening       Need for Tdap vaccination          No follow-ups on file.     Saban Heinlen, NP   

## 2021-05-10 ENCOUNTER — Ambulatory Visit (INDEPENDENT_AMBULATORY_CARE_PROVIDER_SITE_OTHER): Payer: Self-pay | Admitting: Medical-Surgical

## 2021-05-10 DIAGNOSIS — Z91199 Patient's noncompliance with other medical treatment and regimen due to unspecified reason: Secondary | ICD-10-CM

## 2021-05-11 ENCOUNTER — Telehealth: Payer: Self-pay | Admitting: Emergency Medicine

## 2021-05-11 NOTE — Telephone Encounter (Signed)
Called and spoke with patient. She verbalized understanding.   Nothing further needed.  

## 2021-05-11 NOTE — Telephone Encounter (Signed)
Called and spoke with patient. She stated that she received a call from [facility] stating that [facility] ordered a CT on 03/08. She was confused because she has not seen RB since November 2023.  ? ?I reviewed her chart and it looks like a CT scan was ordered during her consult visit on 12/01/20 to be done in April 2023 but the order was cancelled.  ? ?She had a CT angio done back in January 2023 during an ED visit.  ? ?She wants to know if she needs this CT scan.  ? ?RB, can you please advise? Thanks!   ?

## 2021-05-11 NOTE — Telephone Encounter (Signed)
I reviewed her CT scan from January 2023.  She does not need another scan.  You can cancel that order. ?

## 2021-05-13 ENCOUNTER — Inpatient Hospital Stay: Admission: RE | Admit: 2021-05-13 | Payer: BC Managed Care – PPO | Source: Ambulatory Visit

## 2021-05-19 DIAGNOSIS — M170 Bilateral primary osteoarthritis of knee: Secondary | ICD-10-CM | POA: Diagnosis not present

## 2021-05-23 ENCOUNTER — Encounter: Payer: Self-pay | Admitting: Emergency Medicine

## 2021-05-23 ENCOUNTER — Emergency Department (HOSPITAL_BASED_OUTPATIENT_CLINIC_OR_DEPARTMENT_OTHER)
Admission: EM | Admit: 2021-05-23 | Discharge: 2021-05-23 | Disposition: A | Discharge status: Home / Self Care | Payer: BC Managed Care – PPO | Attending: Emergency Medicine | Admitting: Emergency Medicine

## 2021-05-23 ENCOUNTER — Encounter (HOSPITAL_BASED_OUTPATIENT_CLINIC_OR_DEPARTMENT_OTHER): Payer: Self-pay

## 2021-05-23 ENCOUNTER — Other Ambulatory Visit: Payer: Self-pay

## 2021-05-23 DIAGNOSIS — R42 Dizziness and giddiness: Secondary | ICD-10-CM

## 2021-05-23 DIAGNOSIS — I10 Essential (primary) hypertension: Secondary | ICD-10-CM | POA: Diagnosis not present

## 2021-05-23 DIAGNOSIS — Z7901 Long term (current) use of anticoagulants: Secondary | ICD-10-CM | POA: Insufficient documentation

## 2021-05-23 DIAGNOSIS — Z79899 Other long term (current) drug therapy: Secondary | ICD-10-CM | POA: Diagnosis not present

## 2021-05-23 DIAGNOSIS — R0602 Shortness of breath: Secondary | ICD-10-CM

## 2021-05-23 DIAGNOSIS — R519 Headache, unspecified: Secondary | ICD-10-CM | POA: Diagnosis not present

## 2021-05-23 NOTE — Discharge Instructions (Addendum)
Please take your blood pressure once a day, every day.  Keep a record of it and take that record with you when you see either your primary care provider or your cardiologist.  Do not get worried about individual readings that may be very high, it is the long-term average blood pressure that is important.  However, if your blood pressure is consistently high, you will need to have your medication adjusted. ?

## 2021-05-23 NOTE — ED Triage Notes (Signed)
Here for elevated blood pressure. BP check at home was 181/107. Has had 3 episodes of brief dizziness over last 10 days. ?

## 2021-05-23 NOTE — ED Provider Notes (Signed)
?[facility] EMERGENC[facility] DEPARTMENT ?Provider Note ? ? ?CSN: [id number] ?Arrival date & time: 05/23/21  0321 ? ?  ? ?History ? ?Chief Complaint  ?Patient presents with  ? Hypertension  ? ? ?Alyssa [name] is a 61 y.o. female. ? ?The history is provided by the patient.  ?Hypertension ?She has history of hypertension, hyperlipidemia, pulmonary embolism anticoagulated on apixaban and comes in because her blood pressure was high today.  She admits that she does not check her blood pressure often and last time before today was about 10 days ago.  However, today her blood pressure was approximately 180/100.  She had a headache earlier in the day, but she does not have a headache now.  She denies chest pain, heaviness, tightness, pressure.  She denies any nausea or vomiting.  She states she has been compliant with her blood pressure medications. ?  ?Home Medications ?Prior to Admission medications   ?Medication Sig Start Date End Date Taking? Authorizing Provider  ?acetaminophen (TYLENOL) 325 MG tablet Take 2 tablets (650 mg total) by mouth every 6 (six) hours as needed for mild pain or headache. ?Patient not taking: Reported on 04/29/2021 11/05/20   Oswald Hillock, MD  ?apixaban (ELIQUIS) 2.5 MG TABS tablet Take 1 tablet (2.5 mg total) by mouth 2 (two) times daily. 04/29/21   Collene Gobble, MD  ?Azelastine HCl 137 MCG/SPRAY SOLN Place 1 spray into both nostrils 2 (two) times daily as needed (nasal congestion). ?Patient not taking: Reported on 04/29/2021 10/27/20   [provider]  ?fexofenadine (ALLEGRA ALLERGY) 180 MG tablet Take 1 tablet (180 mg total) by mouth daily. 03/30/21   Cobb, Karie Schwalbe, NP  ?fluticasone (FLONASE) 50 MCG/ACT nasal spray Place 2 sprays into both nostrils daily. 03/30/21   Cobb, Karie Schwalbe, NP  ?metoprolol succinate (TOPROL-XL) 50 MG 24 hr tablet Take 1 tablet (50 mg total) by mouth daily. 05/04/21   Park Liter, MD  ?traZODone (DESYREL) 100 MG tablet Take 1 tablet (100 mg  total) by mouth at bedtime. 02/26/21   Samuel Bouche, NP  ?valsartan (DIOVAN) 160 MG tablet Take 1 tablet (160 mg total) by mouth daily. 03/30/21   Park Liter, MD  ?   ? ?Allergies    ?Sulfa antibiotics, Albuterol sulfate, and Lactose intolerance (gi)   ? ?Review of Systems   ?Review of Systems  ?All other systems reviewed and are negative. ? ?Physical Exam ?Updated Vital Signs ?BP (!) 170/91   Pulse 74   Temp 98.4 ?F (36.9 ?C) (Oral)   Resp 16   Ht '5\' 6"'$  (1.676 m)   Wt 128.4 kg   SpO2 97%   BMI 45.68 kg/m?  ?Physical Exam ?Vitals and nursing note reviewed.  ?61 year old female, resting comfortably and in no acute distress. Vital signs are significant for elevated blood pressure. Oxygen saturation is 99%, which is normal. ?Head is normocephalic and atraumatic. PERRLA, EOMI. Oropharynx is clear. ?Neck is nontender and supple without adenopathy or JVD. ?Back is nontender and there is no CVA tenderness. ?Lungs are clear without rales, wheezes, or rhonchi. ?Chest is nontender. ?Heart has regular rate and rhythm without murmur. ?Abdomen is soft, flat, nontender. ?Extremities have 1+ edema, full range of motion is present. ?Skin is warm and dry without rash. ?Neurologic: Mental status is normal, cranial nerves are intact, moves all extremities equally. ? ?ED Results / Procedures / Treatments   ? ?Procedures ?Procedures  ? ? ?Medications Ordered in ED ?Medications - No data  to display ? ?ED Course/ Medical Decision Making/ A&P ?  ?                        ?Medical Decision Making ? ?Elevated blood pressure in patient with history of hypertension.  There is no evidence of accelerated hypertension or hypertensive urgency.  No evidence of acute endorgan effects.  Patient is reassured that there is no need for emergent treatment of blood pressure.  She is encouraged to check her blood pressure daily at home and work with her primary care provider to appropriately adjust her antihypertensive regimen.  However, I do  notice that she has peripheral edema and she likely will need a diuretic added to her regimen. ? ?Final Clinical Impression(s) / ED Diagnoses ?Final diagnoses:  ?Elevated blood pressure reading with diagnosis of hypertension  ? ? ?Rx / DC Orders ?ED Discharge Orders   ? ? None  ? ?  ? ? ?  ?Delora Fuel, MD ?95/09/32 0423 ? ?

## 2021-05-24 ENCOUNTER — Other Ambulatory Visit: Payer: Self-pay

## 2021-05-24 ENCOUNTER — Encounter: Payer: Self-pay | Admitting: Adult Health

## 2021-05-24 ENCOUNTER — Telehealth: Payer: Self-pay | Admitting: Cardiology

## 2021-05-24 ENCOUNTER — Telehealth: Payer: Self-pay | Admitting: General Practice

## 2021-05-24 ENCOUNTER — Encounter: Payer: Self-pay | Admitting: Medical-Surgical

## 2021-05-24 ENCOUNTER — Ambulatory Visit (INDEPENDENT_AMBULATORY_CARE_PROVIDER_SITE_OTHER): Payer: BC Managed Care – PPO | Admitting: Adult Health

## 2021-05-24 DIAGNOSIS — R0602 Shortness of breath: Secondary | ICD-10-CM

## 2021-05-24 DIAGNOSIS — I10 Essential (primary) hypertension: Secondary | ICD-10-CM

## 2021-05-24 DIAGNOSIS — J4520 Mild intermittent asthma, uncomplicated: Secondary | ICD-10-CM

## 2021-05-24 DIAGNOSIS — R42 Dizziness and giddiness: Secondary | ICD-10-CM

## 2021-05-24 DIAGNOSIS — Z86711 Personal history of pulmonary embolism: Secondary | ICD-10-CM

## 2021-05-24 LAB — BASIC METABOLIC PANEL
BUN: 14 mg/dL (ref 6–23)
CO2: 25 mEq/L (ref 19–32)
Calcium: 9.2 mg/dL (ref 8.4–10.5)
Chloride: 107 mEq/L (ref 96–112)
Creatinine, Ser: 0.93 mg/dL (ref 0.40–1.20)
GFR: 66.17 mL/min (ref >60.00)
Glucose, Bld: 103 mg/dL — ABNORMAL HIGH (ref 70–99)
Potassium: 4.1 mEq/L (ref 3.5–5.1)
Sodium: 141 mEq/L (ref 135–145)

## 2021-05-24 LAB — CBC WITH DIFFERENTIAL/PLATELET
Basophils Absolute: 0 10*3/uL (ref 0.0–0.1)
Basophils Relative: 0.3 % (ref 0.0–3.0)
Eosinophils Absolute: 0 10*3/uL (ref 0.0–0.7)
Eosinophils Relative: 0.6 % (ref 0.0–5.0)
HCT: 38 % (ref 36.0–46.0)
Hemoglobin: 12.1 g/dL (ref 12.0–15.0)
Lymphocytes Relative: 16.6 % (ref 12.0–46.0)
Lymphs Abs: 1.4 10*3/uL (ref 0.7–4.0)
MCHC: 32 g/dL (ref 30.0–36.0)
MCV: 76.8 fl — ABNORMAL LOW (ref 78.0–100.0)
Monocytes Absolute: 0.6 10*3/uL (ref 0.1–1.0)
Monocytes Relative: 7.9 % (ref 3.0–12.0)
Neutro Abs: 6.2 10*3/uL (ref 1.4–7.7)
Neutrophils Relative %: 74.6 % (ref 43.0–77.0)
Platelets: 259 10*3/uL (ref 150.0–400.0)
RBC: 4.94 Mil/uL (ref 3.87–5.11)
RDW: 17 % — ABNORMAL HIGH (ref 11.5–15.5)
WBC: 8.2 10*3/uL (ref 4.0–10.5)

## 2021-05-24 LAB — BRAIN NATRIURETIC PEPTIDE: Pro B Natriuretic peptide (BNP): 96 pg/mL (ref 0.0–100.0)

## 2021-05-24 NOTE — Telephone Encounter (Signed)
Mychart message sent by pt: ?[facility] (supporting Collene Gobble, MD) 13 hours ago (9:07 PM)  ? ?ER ?I have been on Eliquis 3 weeks and for the last 2 I have had headaches every day (sometimes they ease up with Tylenol).  ?I thought Is was Sinuses but figured it wasn?t. Over the pass 10 days I have been dizzy 3 times - just a short dizzy spell >2 minutes. I have had diarrhea the last 10 days almost every other day or so.  ?On have noticed that my blood pressure rises after taking Eliquis for about 6 hours - noticed this the last three days.  ?  ?I am not sure it is is directly tied to the Eliquis but I have felt sick. I am still monitoring my blood pressure and the times that it is elevated.  ?I am on blood pressure medicine and take it everyday. I have reached out to my heart doctor who manages my blood pressure but I am not sure it?s not the Eliquis.  ?  ?I went to the Urgent care yesterday but they spent me home as my blood pressure started to drop.  ?  ?Please help. I am available to come in if you need me to - I only need 20 minutes advance.  Thanks. Latorya ? ? ? ? ?Tammy, please advise. ?

## 2021-05-24 NOTE — Telephone Encounter (Signed)
Transition Care Management Follow-up Telephone Call ?Date of discharge and from where: 05/23/21 from [facility] ?How have you been since you were released from the hospital? Went to the hospital for elevated blood pressure. Feels that the eliquis and her BP meds are not agreeing together. She has reached out to cardiology and pulmonology, who prescribed her medications, and is waiting to hear back.  ?Any questions or concerns? No ? ?Items Reviewed: ?Did the pt receive and understand the discharge instructions provided? Yes  ?Medications obtained and verified? No  ?Other? No  ?Any new allergies since your discharge? No  ?Dietary orders reviewed? Yes ?Do you have support at home? Yes  ? ?Home Care and Equipment/Supplies: ?Were home health services ordered? no ? ?Functional Questionnaire: (I = Independent and D = Dependent) ?ADLs: I ? ?Bathing/Dressing- I ? ?Meal Prep- I ? ?Eating- I ? ?Maintaining continence- I ? ?Transferring/Ambulation- I ? ?Managing Meds- I ? ?Follow up appointments reviewed: ? ?PCP Hospital f/u appt confirmed? Yes  Scheduled to see Samuel Bouche, NP on 05/25/21 @ 1320. ?[facility] f/u appt confirmed? No   ?Are transportation arrangements needed? No  ?If their condition worsens, is the pt aware to call PCP or go to the Emergency Dept.? Yes ?Was the patient provided with contact information for the PCP's office or ED? Yes ?Was to pt encouraged to call back with questions or concerns? Yes  ?

## 2021-05-24 NOTE — Progress Notes (Deleted)
Erroneous encounter note.

## 2021-05-24 NOTE — Telephone Encounter (Signed)
Called and spoke with pt letting her know the info per TP and have scheduled her for an appt at 2pm.  Nothing further needed. ?

## 2021-05-24 NOTE — Progress Notes (Signed)
? ?'@Patient'$  ID: Alyssa [name], female    DOB: 1960/08/02, 61 y.o.   MRN: [id number] ? ?Chief Complaint  ?Patient presents with  ? Acute Visit  ? ? ?Referring provider: ?Samuel Bouche, NP ? ?HPI: ?61 year old female never smoker seen for pulmonary consult November 2022 for shortness of breath, cough and lung nodule.  Patient was diagnosed with bilateral PE and right DVT and was started on anticoagulation therapy.  Was felt to be possibly provoked with immobility due to severe arthritis. ? ? ?TEST/EVENTS :  ?CT-chest  11/03/2020  shows multiple bilateral pulmonary emboli without any evidence of right heart strain.  There was a round 12 mm left posterior costophrenic angle nodule without effusion ? ?December 2022 for COPD exacerbation with influenza.,  Treated with Tamiflu ? ?05/24/2021 Acute OV   ;PE /Asthma  ?Patient presents for an acute office visit.  Patient complains that she has had elevated blood pressure and headaches since changing to Eliquis. No known bleeding .  ?Patient has a history of PE and right DVT November 2022.  She was treated with Xarelto from November until end of March 2023.  She was discontinued off of Xarelto.  And started on low-dose lifelong Eliquis. ?O2 saturations today in office are 97% on room air. ?Has underlying asthma and allergies . No flare in cough or wheezing  ?No visual changes or speech changes .  ?Went to ER yesterday , exam unrevealing . No labs or xray done .  ? ? ? ? ? ?Allergies  ?Allergen Reactions  ? Sulfa Antibiotics Hives  ? Albuterol Sulfate Cough and Nausea And Vomiting  ? Lactose Intolerance (Gi)   ? ? ?Immunization History  ?Administered Date(s) Administered  ? Influenza Split 12/28/2012  ? Influenza,inj,quad, With Preservative 10/16/2015  ? Influenza-Unspecified 10/16/2015  ? PFIZER(Purple Top)SARS-COV-2 Vaccination 03/28/2019, 04/25/2019, 12/24/2019, 09/02/2020  ? PPD Test 10/14/2011  ? Pneumococcal Polysaccharide-23 08/28/2014  ? Tdap 11/26/2008  ? ? ?Past  Medical History:  ?Diagnosis Date  ? Anxiety 05/2018  ? Arthritis   ? Asthma   ? Costochondritis   ? DVT (deep venous thrombosis) ([place])   ? GERD (gastroesophageal reflux disease)   ? Hypertension   ? Pulmonary emboli ([place])   ? ? ?Tobacco History: ?Social History  ? ?Tobacco Use  ?Smoking Status Never  ?Smokeless Tobacco Never  ?Tobacco Comments  ? Exposure to second hand smoke  ? ?Counseling given: Not Answered ?Tobacco comments: Exposure to second hand smoke ? ? ?Outpatient Medications Prior to Visit  ?Medication Sig Dispense Refill  ? acetaminophen (TYLENOL) 325 MG tablet Take 2 tablets (650 mg total) by mouth every 6 (six) hours as needed for mild pain or headache.    ? apixaban (ELIQUIS) 2.5 MG TABS tablet Take 1 tablet (2.5 mg total) by mouth 2 (two) times daily. 60 tablet 11  ? Azelastine HCl 137 MCG/SPRAY SOLN Place 1 spray into both nostrils 2 (two) times daily as needed (nasal congestion).    ? fexofenadine (ALLEGRA ALLERGY) 180 MG tablet Take 1 tablet (180 mg total) by mouth daily. 30 tablet 2  ? fluticasone (FLONASE) 50 MCG/ACT nasal spray Place 2 sprays into both nostrils daily. 18.2 mL 2  ? metoprolol succinate (TOPROL-XL) 50 MG 24 hr tablet Take 1 tablet (50 mg total) by mouth daily. 90 tablet 1  ? traZODone (DESYREL) 100 MG tablet Take 1 tablet (100 mg total) by mouth at bedtime. 90 tablet 1  ? valsartan (DIOVAN) 160 MG tablet Take 1 tablet (160 mg  total) by mouth daily. 90 tablet 2  ? ?No facility-administered medications prior to visit.  ? ? ? ?Review of Systems:  ? ?Constitutional:   No  weight loss, night sweats,  Fevers, chills,  ?+fatigue, or  lassitude. ? ?HEENT:   No  Difficulty swallowing,  Tooth/dental problems, or  Sore throat,  ?              No sneezing, itching, ear ache, nasal congestion, post nasal drip,  ? ?CV:  No chest pain,  Orthopnea, PND, swelling in lower extremities, anasarca, dizziness, palpitations, syncope.  ? ?GI  No heartburn, indigestion, abdominal pain, nausea, vomiting,  diarrhea, change in bowel habits, loss of appetite, bloody stools.  ? ?Resp:   No excess mucus, no productive cough,  No non-productive cough,  No coughing up of blood.  No change in color of mucus.  No wheezing.  No chest wall deformity ? ?Skin: no rash or lesions. ? ?GU: no dysuria, change in color of urine, no urgency or frequency.  No flank pain, no hematuria  ? ?MS:  No joint pain or swelling.  No decreased range of motion.  No back pain. ? ? ? ?Physical Exam ? ?BP (!) 150/70 (BP Location: Left Arm, Patient Position: Sitting, Cuff Size: Large)   Pulse 81   Temp 98.4 ?F (36.9 ?C) (Oral)   Ht '5\' 6"'$  (1.676 m)   Wt 282 lb 3.2 oz (128 kg)   SpO2 97%   BMI 45.55 kg/m?  ? ?GEN: A/Ox3; pleasant , NAD, well nourished  ?  ?HEENT:  [place]/AT,  , NOSE-clear, THROAT-clear, no lesions, no postnasal drip or exudate noted.  ? ?NECK:  Supple w/ fair ROM; no JVD; normal carotid impulses w/o bruits; no thyromegaly or nodules palpated; no lymphadenopathy.   ? ?RESP  Clear  P & A; w/o, wheezes/ rales/ or rhonchi. no accessory muscle use, no dullness to percussion ? ?CARD:  RRR, no m/r/g, no peripheral edema, pulses intact, no cyanosis or clubbing. ? ?GI:   Soft & nt; nml bowel sounds; no organomegaly or masses detected.  ? ?Musco: Warm bil, no deformities or joint swelling noted.  ? ?Neuro: alert, no focal deficits noted.   ? ?Skin: Warm, no lesions or rashes ? ? ? ?Lab Results: ? ?CBC ? ? ? ?BNP ?   ?Component Value Date/Time  ? BNP 27.6 02/19/2021 2034  ? ? ?ProBNP ?No results found for: PROBNP ? ?Imaging: ?No results found. ? ? ? ? ?  Latest Ref Rng & Units 01/15/2021  ? 10:54 AM  ?PFT Results  ?FVC-Pre L 2.36    ?FVC-Predicted Pre % 82    ?FVC-Post L 2.27    ?FVC-Predicted Post % 79    ?Pre FEV1/FVC % % 86    ?Post FEV1/FCV % % 87    ?FEV1-Pre L 2.04    ?FEV1-Predicted Pre % 90    ?FEV1-Post L 1.97    ?DLCO uncorrected ml/min/mmHg 15.47    ?DLCO UNC% % 71    ?DLCO corrected ml/min/mmHg 15.77    ?DLCO COR %Predicted % 73     ?DLVA Predicted % 123    ?TLC L 3.86    ?TLC % Predicted % 72    ?RV % Predicted % 75    ? ? ?No results found for: NITRICOXIDE ? ? ? ? ? ?Assessment & Plan:  ? ?No problem-specific Assessment & Plan notes found for this encounter. ? ? ? ? ?Rexene Edison, NP ?05/24/2021 ? ?

## 2021-05-24 NOTE — Telephone Encounter (Signed)
Spoke with pt and made her a ED FU appt in [place] for 05/25/21 to discuss elevated BP. Pt states that her BP has been being elevated for the past few days and she was seen in the ED without any medication changes. Pt states she is leaving the ED today and will wait until her appointment tomorrow. Pt states she will monitor her BP today and bring a log tomorrow to her appointment. Pt denies chest pain only a headache. Pt aware not to take NSAIDs as they can increase her BP. Pt verbalized understanding and had no additional questions. ?

## 2021-05-24 NOTE — Patient Instructions (Addendum)
Take Eliquis with food.  ? ?Albuterol inhaler As needed   ?Flonase 2 sprays each nostril daily for nasal congestion ?Allegra 180 mg daily for allergies ?Delsym 2 tsp Twice daily for cough As needed   ?Mucinex 600 mg Twice daily for congestion/cough As needed   ?Chlortab 4 mg At bedtime for cough  ?Saline nasal irrigations 1-2 times a day ? ?Labs today .  ? ?Follow up with PCP regarding thyroid cyst.  ? ?Follow up with Dr. Lamonte Sakai  as planned and As needed   ?Follow up with Cardiology tomorrow as planned .  ?Please contact office for sooner follow up if symptoms do not improve or worsen or seek emergency care  ? ? ?

## 2021-05-24 NOTE — Telephone Encounter (Signed)
Pt c/o BP issue: STAT if pt c/o blurred vision, one-sided weakness or slurred speech ? ?1. What are your last 5 BP readings? 185/107, today  been high 185/111- [facility] to  ? ?2. Are you having any other symptoms (ex. Dizziness, headache, blurred vision, passed out)? Headaches, had 3 dizzy spells , last Friday , Monday and Tuesday  ? ?3. What is your BP issue? High blood pressure, patient would like to be seen by somebody today ? ?

## 2021-05-24 NOTE — ED Notes (Signed)
CALLED X 2 NO ANSWER ?

## 2021-05-24 NOTE — Telephone Encounter (Signed)
I will be glad to see today , double book at 2 pm  get here at 1:45 with labs CBC and bmet, bnp  ?

## 2021-05-25 ENCOUNTER — Encounter: Payer: Self-pay | Admitting: Cardiology

## 2021-05-25 ENCOUNTER — Inpatient Hospital Stay: Payer: BC Managed Care – PPO | Admitting: Medical-Surgical

## 2021-05-25 ENCOUNTER — Ambulatory Visit (INDEPENDENT_AMBULATORY_CARE_PROVIDER_SITE_OTHER): Payer: BC Managed Care – PPO | Admitting: Cardiology

## 2021-05-25 VITALS — BP 136/76 | HR 70 | Ht 66.0 in | Wt 282.2 lb

## 2021-05-25 DIAGNOSIS — I10 Essential (primary) hypertension: Secondary | ICD-10-CM | POA: Diagnosis not present

## 2021-05-25 DIAGNOSIS — I2699 Other pulmonary embolism without acute cor pulmonale: Secondary | ICD-10-CM

## 2021-05-25 DIAGNOSIS — Z7901 Long term (current) use of anticoagulants: Secondary | ICD-10-CM

## 2021-05-25 DIAGNOSIS — E782 Mixed hyperlipidemia: Secondary | ICD-10-CM | POA: Diagnosis not present

## 2021-05-25 NOTE — Patient Instructions (Addendum)
Medication Instructions:  ?Your physician has recommended you make the following change in your medication: Diovan 1/2 tablet in the morning- 1/2 tablet in the evening  ? ? ?Lab Work: ?None Ordered ?If you have labs (blood work) drawn today and your tests are completely normal, you will receive your results only by: ?MyChart Message (if you have MyChart) OR ?A paper copy in the mail ?If you have any lab test that is abnormal or we need to change your treatment, we will call you to review the results. ? ? ?Testing/Procedures: ?[facility]. Will call when they come in. ? ? ?Follow-Up: ?At [facility], you and your health needs are our priority.  As part of our continuing mission to provide you with exceptional heart care, we have created designated Provider Care Teams.  These Care Teams include your primary Cardiologist (physician) and Advanced Practice Providers (APPs -  Physician Assistants and Nurse Practitioners) who all work together to provide you with the care you need, when you need it. ? ?We recommend signing up for the patient portal called "MyChart".  Sign up information is provided on this After Visit Summary.  MyChart is used to connect with patients for Virtual Visits (Telemedicine).  Patients are able to view lab/test results, encounter notes, upcoming appointments, etc.  Non-urgent messages can be sent to your provider as well.   ?To learn more about what you can do with MyChart, go to [email or web address].   ? ?Your next appointment:   ?3 month(s) ? ?The format for your next appointment:   ?In Person ? ?Provider:   ?Jenne Campus, MD  ? ? ?Other Instructions ?BP log for 2 weeks - Bring by to office ?

## 2021-05-25 NOTE — Progress Notes (Signed)
?Cardiology Office Note:   ? ?Date:  05/25/2021  ? ?ID:  Alyssa [name], DOB 01/09/1961, MRN [id number] ? ?PCP:  Samuel Bouche, NP  ?Cardiologist:  Jenne Campus, MD   ? ?Referring MD: Samuel Bouche, NP  ? ?Chief Complaint  ?Patient presents with  ? Hypertension  ? ? ?History of Present Illness:   ? ?Alyssa [name] is a 61 y.o. female  With past medical history significant for DVT pulmonary emboli, anticoagulated, essential hypertension, dyslipidemia, borderline diabetes.  Recently for some reason she started checking her blood pressure on the regular basis actually what triggered this is the fact that she was getting headaches she is say that she check blood pressure many times a day 5-6 times a day and she could no longer her blood pressure gets very high.  She end up being in the emergency room because of that reason however there was no critical blood pressure elevation she was discharged home.  She denies have any chest pain tightness squeezing pressure burning chest otherwise she seems to be doing well ? ?Past Medical History:  ?Diagnosis Date  ? Anxiety 05/2018  ? Arthritis   ? Asthma   ? Costochondritis   ? DVT (deep venous thrombosis) ([place])   ? GERD (gastroesophageal reflux disease)   ? Hypertension   ? Pulmonary emboli ([place])   ? ? ?Past Surgical History:  ?Procedure Laterality Date  ? CESAREAN SECTION    ? CHOLECYSTECTOMY    ? KNEE ARTHROSCOPY W/ MENISCAL REPAIR Left   ? LUMBAR DISC ARTHROPLASTY    ? [place] SURGERY  2006  ? ? ?Current Medications: ?Current Meds  ?Medication Sig  ? acetaminophen (TYLENOL) 325 MG tablet Take 2 tablets (650 mg total) by mouth every 6 (six) hours as needed for mild pain or headache.  ? apixaban (ELIQUIS) 2.5 MG TABS tablet Take 1 tablet (2.5 mg total) by mouth 2 (two) times daily.  ? Azelastine HCl 137 MCG/SPRAY SOLN Place 1 spray into both nostrils 2 (two) times daily as needed (nasal congestion).  ? fexofenadine (ALLEGRA ALLERGY) 180 MG tablet Take 1 tablet (180 mg total)  by mouth daily.  ? fluticasone (FLONASE) 50 MCG/ACT nasal spray Place 2 sprays into both nostrils daily.  ? metoprolol succinate (TOPROL-XL) 50 MG 24 hr tablet Take 1 tablet (50 mg total) by mouth daily.  ? traZODone (DESYREL) 100 MG tablet Take 1 tablet (100 mg total) by mouth at bedtime.  ? valsartan (DIOVAN) 160 MG tablet Take 1 tablet (160 mg total) by mouth daily.  ?  ? ?Allergies:   Sulfa antibiotics, Albuterol sulfate, and Lactose intolerance (gi)  ? ?Social History  ? ?Socioeconomic History  ? Marital status: Single  ?  Spouse name: Not on file  ? Number of children: Not on file  ? Years of education: Not on file  ? Highest education level: Not on file  ?Occupational History  ? Not on file  ?Tobacco Use  ? Smoking status: Never  ? Smokeless tobacco: Never  ? Tobacco comments:  ?  Exposure to second hand smoke  ?Substance and Sexual Activity  ? Alcohol use: Never  ? Drug use: Never  ? Sexual activity: Not Currently  ?  Birth control/protection: None  ?Other Topics Concern  ? Not on file  ?Social History Narrative  ? Not on file  ? ?Social Determinants of Health  ? ?Financial Resource Strain: Not on file  ?Food Insecurity: Not on file  ?Transportation Needs: Not on file  ?Physical  Activity: Not on file  ?Stress: Not on file  ?Social Connections: Not on file  ?  ? ?Family History: ?The patient's family history includes Congestive Heart Failure in her mother; Heart disease in her brother and mother; Hypertension in her mother; Stroke in her mother. ?ROS:   ?Please see the history of present illness.    ?All 14 point review of systems negative except as described per history of present illness ? ?EKGs/Labs/Other Studies Reviewed:   ? ? ? ?Recent Labs: ?11/27/2020: ALT 11 ?02/19/2021: B Natriuretic Peptide 27.6 ?05/24/2021: BUN 14; Creatinine, Ser 0.93; Hemoglobin 12.1; Platelets 259.0; Potassium 4.1; Pro B Natriuretic peptide (BNP) 96.0; Sodium 141  ?Recent Lipid Panel ?   ?Component Value Date/Time  ? CHOL 144  01/26/2021 0943  ? TRIG 115 01/26/2021 0943  ? HDL 39 (L) 01/26/2021 0943  ? CHOLHDL 3.7 01/26/2021 0943  ? [place] 84 01/26/2021 0943  ? ? ?Physical Exam:   ? ?VS:  BP 136/76 (BP Location: Left Arm, Patient Position: Sitting)   Pulse 70   Ht '5\' 6"'$  (1.676 m)   Wt 282 lb 3.2 oz (128 kg)   BMI 45.55 kg/m?    ? ?Wt Readings from Last 3 Encounters:  ?05/25/21 282 lb 3.2 oz (128 kg)  ?05/24/21 282 lb 3.2 oz (128 kg)  ?05/23/21 283 lb (128.4 kg)  ?  ? ?GEN:  Well nourished, well developed in no acute distress ?HEENT: Normal ?NECK: No JVD; No carotid bruits ?LYMPHATICS: No lymphadenopathy ?CARDIAC: RRR, no murmurs, no rubs, no gallops ?RESPIRATORY:  Clear to auscultation without rales, wheezing or rhonchi  ?ABDOMEN: Soft, non-tender, non-distended ?MUSCULOSKELETAL:  No edema; No deformity  ?SKIN: Warm and dry ?LOWER EXTREMITIES: no swelling ?NEUROLOGIC:  Alert and oriented x 3 ?PSYCHIATRIC:  Normal affect  ? ?ASSESSMENT:   ? ?1. Primary hypertension   ?2. Pulmonary embolism on long-term anticoagulation therapy ([place])   ?3. Hyperlipidemia, mixed   ? ?PLAN:   ? ?In order of problems listed above: ? ?Essential hypertension uncontrolled, blood pressure is still a bit elevated but not dramatically.  Ask her to stop checking blood pressure 6 times a day I told her once a day at different days different times a day can take and out of it I will check Chem-7 with anticipation of potentially adding some diuretic.  Also ask her to split Diovan and a half take half in the morning half in the afternoon. ?History of pulmonary emboli she is taking anticoagulation which I will continue. ?Hyperlipidemia, followed by internal medicine team ? ?Medication Adjustments/Labs and Tests Ordered: ?Current medicines are reviewed at length with the patient today.  Concerns regarding medicines are outlined above.  ?No orders of the defined types were placed in this encounter. ? ?Medication changes: No orders of the defined types were placed in  this encounter. ? ? ?Signed, ?Park Liter, MD, [facility] ?05/25/2021 4:06 PM    ?[place] ?

## 2021-05-26 ENCOUNTER — Telehealth: Payer: Self-pay | Admitting: *Deleted

## 2021-05-26 NOTE — Telephone Encounter (Signed)
-----   Message from Tyler Pita, RN sent at 05/25/2021  2:54 PM EDT ----- ?Pt needs [facility] precerted. Thank You! Dede ? ?

## 2021-05-27 NOTE — Assessment & Plan Note (Signed)
History of PE and DVT.  Patient is now going to be on lifelong anticoagulation with low-dose Eliquis.  Doubt that Eliquis is contributing to her current symptoms.  We will check labs including CBC and be met. ? ?Plan ?Patient Instructions  ?Take Eliquis with food.  ? ?Albuterol inhaler As needed   ?Flonase 2 sprays each nostril daily for nasal congestion ?Allegra 180 mg daily for allergies ?Delsym 2 tsp Twice daily for cough As needed   ?Mucinex 600 mg Twice daily for congestion/cough As needed   ?Chlortab 4 mg At bedtime for cough  ?Saline nasal irrigations 1-2 times a day ? ?Labs today .  ? ?Follow up with PCP regarding thyroid cyst.  ? ?Follow up with Dr. Lamonte Sakai  as planned and As needed   ?Follow up with Cardiology tomorrow as planned .  ?Please contact office for sooner follow up if symptoms do not improve or worsen or seek emergency care  ? ? ?  ? ?

## 2021-05-27 NOTE — Assessment & Plan Note (Signed)
Under control continue on trigger prevention ?

## 2021-05-27 NOTE — Assessment & Plan Note (Signed)
Elevated blood pressure.  Doubt is related to Eliquis.  Patient is continue follow-up with cardiology.  Daily blood pressure log. ?

## 2021-05-28 NOTE — Progress Notes (Signed)
ATC x1.  No answer.  LVM to return call.

## 2021-05-31 NOTE — Progress Notes (Signed)
Called and spoke with patient, provided results/recommendations per Tammy Parrett NP.  She verbalized understanding.  Nothing further needed.

## 2021-06-08 ENCOUNTER — Other Ambulatory Visit: Payer: Self-pay | Admitting: Nurse Practitioner

## 2021-06-08 DIAGNOSIS — J302 Other seasonal allergic rhinitis: Secondary | ICD-10-CM

## 2021-06-08 DIAGNOSIS — R058 Other specified cough: Secondary | ICD-10-CM

## 2021-06-09 ENCOUNTER — Telehealth: Payer: Self-pay

## 2021-06-09 ENCOUNTER — Other Ambulatory Visit: Payer: Self-pay

## 2021-06-09 NOTE — Telephone Encounter (Signed)
No prior approval required for Itamar. I called and LM to return my call.  ?

## 2021-06-11 ENCOUNTER — Encounter: Payer: Self-pay | Admitting: Cardiology

## 2021-06-14 DIAGNOSIS — M1711 Unilateral primary osteoarthritis, right knee: Secondary | ICD-10-CM | POA: Diagnosis not present

## 2021-06-15 ENCOUNTER — Other Ambulatory Visit: Payer: Self-pay

## 2021-06-15 ENCOUNTER — Telehealth: Payer: Self-pay

## 2021-06-15 MED ORDER — VALSARTAN 80 MG PO TABS: 80.0000 mg | ORAL_TABLET | Freq: Two times a day (BID) | ORAL | 3 refills | Status: DC

## 2021-06-15 NOTE — Telephone Encounter (Signed)
Sent Valsartan '80mg'$  BID to pharmacy per Dr. Wendy Poet note.  ?

## 2021-06-20 DIAGNOSIS — J302 Other seasonal allergic rhinitis: Secondary | ICD-10-CM

## 2021-06-20 DIAGNOSIS — R058 Other specified cough: Secondary | ICD-10-CM

## 2021-06-21 ENCOUNTER — Telehealth: Payer: Self-pay

## 2021-06-21 NOTE — Telephone Encounter (Addendum)
Left another message to return my call regarding Itamar pick up. (Device approved.

## 2021-06-22 MED ORDER — FLUTICASONE PROPIONATE 50 MCG/ACT NA SUSP: 2.0000 | Freq: Every day | NASAL | 2 refills | Status: DC

## 2021-06-22 MED ORDER — FEXOFENADINE HCL 180 MG PO TABS: 180.0000 mg | ORAL_TABLET | Freq: Every day | ORAL | 2 refills | Status: DC

## 2021-06-24 ENCOUNTER — Ambulatory Visit: Payer: BC Managed Care – PPO | Admitting: Emergency Medicine
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
## 2021-07-12 NOTE — Telephone Encounter (Signed)
Will be glad to add her today to my schedule for evaluation for asthma flare

## 2021-07-26 ENCOUNTER — Ambulatory Visit: Payer: BC Managed Care – PPO | Admitting: Cardiology

## 2021-08-10 ENCOUNTER — Encounter: Payer: Self-pay | Admitting: Emergency Medicine

## 2021-08-10 NOTE — Telephone Encounter (Signed)
I don't know of any contraindications for the cherry supplements with any of her medications. Should be ok to use

## 2021-09-06 ENCOUNTER — Ambulatory Visit: Payer: BC Managed Care – PPO | Admitting: Cardiology

## 2021-09-07 ENCOUNTER — Ambulatory Visit: Payer: BC Managed Care – PPO | Admitting: Medical-Surgical

## 2021-09-13 DIAGNOSIS — M25561 Pain in right knee: Secondary | ICD-10-CM | POA: Insufficient documentation

## 2021-09-13 HISTORY — DX: Pain in right knee: M25.561

## 2021-09-15 DIAGNOSIS — M170 Bilateral primary osteoarthritis of knee: Secondary | ICD-10-CM | POA: Diagnosis not present

## 2021-09-20 ENCOUNTER — Other Ambulatory Visit: Payer: Self-pay | Admitting: Adult Health

## 2021-09-20 DIAGNOSIS — J302 Other seasonal allergic rhinitis: Secondary | ICD-10-CM

## 2021-09-20 DIAGNOSIS — R058 Other specified cough: Secondary | ICD-10-CM

## 2021-10-01 ENCOUNTER — Encounter: Payer: BC Managed Care – PPO | Admitting: Family Medicine

## 2021-10-01 ENCOUNTER — Ambulatory Visit: Payer: BC Managed Care – PPO | Admitting: Cardiology

## 2021-10-01 NOTE — Progress Notes (Deleted)
Annual Wellness Visit     Patient: Alyssa [name], Female    DOB: 1960/11/22, 61 y.o.   MRN: [id number]  Subjective  No chief complaint on file.   Alyssa [name] is a 61 y.o. female who presents today for her Annual Wellness Visit. She reports consuming a {diet types:17450} diet. {Exercise:19826} She generally feels {well/fairly well/poorly:18703}. She reports sleeping {well/fairly well/poorly:18703}. She {does/does not:200015} have additional problems to discuss today.   HPI  {VISON DENTAL STD PSA (Optional):27386}   {History (Optional):23778}  Medications: Outpatient Medications Prior to Visit  Medication Sig   acetaminophen (TYLENOL) 325 MG tablet Take 2 tablets (650 mg total) by mouth every 6 (six) hours as needed for mild pain or headache.   apixaban (ELIQUIS) 2.5 MG TABS tablet Take 1 tablet (2.5 mg total) by mouth 2 (two) times daily.   Azelastine HCl 137 MCG/SPRAY SOLN Place 1 spray into both nostrils 2 (two) times daily as needed (nasal congestion).   fexofenadine (ALLEGRA) 180 MG tablet Take 1 tablet (180 mg total) by mouth daily.   fluticasone (FLONASE) 50 MCG/ACT nasal spray SPRAY 2 SPRAYS INTO EACH NOSTRIL EVERY DAY   metoprolol succinate (TOPROL-XL) 50 MG 24 hr tablet Take 1 tablet (50 mg total) by mouth daily.   traZODone (DESYREL) 100 MG tablet Take 1 tablet (100 mg total) by mouth at bedtime.   valsartan (DIOVAN) 80 MG tablet Take 1 tablet (80 mg total) by mouth 2 (two) times daily.   No facility-administered medications prior to visit.    Allergies  Allergen Reactions   Sulfa Antibiotics Hives   Albuterol Sulfate Cough and Nausea And Vomiting   Lactose Intolerance (Gi)     Patient Care Team: Samuel Bouche, NP as PCP - General (Nurse Practitioner)  ROS      Objective  There were no vitals taken for this visit. {Vitals History (Optional):23777}  Physical Exam    Most recent functional status assessment:    11/03/2020    5:20 PM  In  your present state of health, do you have any difficulty performing the following activities:  Hearing? 0  Vision? 0  Difficulty concentrating or making decisions? 0  Walking or climbing stairs? 1  Comment OA of bilat knees  Dressing or bathing? 0  Doing errands, shopping? 0   Most recent fall risk assessment:    01/19/2021    3:51 PM  [place] in the past year? 0  Number falls in past yr: 0  Injury with Fall? 0  Risk for fall due to : No Fall Risks  Follow up Falls evaluation completed    Most recent depression screenings:    01/19/2021    3:51 PM 11/10/2020   10:23 AM  PHQ 2/9 Scores  PHQ - 2 Score 1 0  PHQ- 9 Score 2    Most recent cognitive screening:     No data to display         Most recent Audit-C alcohol use screening     No data to display         A score of 3 or more in women, and 4 or more in men indicates increased risk for alcohol abuse, EXCEPT if all of the points are from question 1   Vision/Hearing Screen: No results found.  {Labs (Optional):23779}  No results found for any visits on 10/01/21.    Assessment & Plan   Annual wellness visit done today including the all of the following: Reviewed  patient's Family Medical History Reviewed and updated list of patient's medical providers Assessment of cognitive impairment was done Assessed patient's functional ability Established a written schedule for health screening [place] Completed and Reviewed  Exercise Activities and Dietary recommendations  Goals   None     Immunization History  Administered Date(s) Administered   Influenza Split 12/28/2012   Influenza,inj,quad, With Preservative 10/16/2015   Influenza-Unspecified 10/16/2015   PFIZER(Purple Top)SARS-COV-2 Vaccination 03/28/2019, 04/25/2019, 12/24/2019, 09/02/2020   PPD Test 10/14/2011   Pneumococcal Polysaccharide-23 08/28/2014   Tdap 11/26/2008    Health Maintenance  Topic Date Due    HIV Screening  Never done   Hepatitis C Screening  Never done   MAMMOGRAM  Never done   Zoster Vaccines- Shingrix (1 of 2) Never done   COVID-19 Vaccine (5 - Pfizer series) 10/28/2020   INFLUENZA VACCINE  08/31/2021   TETANUS/TDAP  01/19/2022 (Originally 11/27/2018)   PAP SMEAR-Modifier  08/10/2022   COLONOSCOPY (Pts 45-42yr Insurance coverage will need to be confirmed)  04/10/2030   HPV VACCINES  Aged Out     Discussed health benefits of physical activity, and encouraged her to engage in regular exercise appropriate for her age and condition.    Problem List Items Addressed This Visit   None   No follow-ups on file.     EOwens Loffler DO

## 2021-10-05 ENCOUNTER — Encounter: Payer: Self-pay | Admitting: Emergency Medicine

## 2021-10-06 NOTE — Telephone Encounter (Signed)
Usually the dentist has to give instructions about how soon they want the Eliquis held, when the patient can restart.  Typically I would stop Eliquis 2 days prior to an invasive procedure.  She needs to contact her dentist and confirm what their instructions would be.  It should be okay for her to hold the Eliquis for as long as the dentist recommends

## 2021-10-13 ENCOUNTER — Telehealth: Payer: Self-pay | Admitting: Emergency Medicine

## 2021-10-13 NOTE — Telephone Encounter (Signed)
Dentist office called and is faxing over a form for RB to fill out regarding Eliquis. Patient is wanting to come in on Monday, so they are needing the form back as soon as possible.

## 2021-10-13 NOTE — Telephone Encounter (Signed)
The fax from the dental office is in [place] box. Dental office is asking for this to be completed asap, as the surgery is on Mon 9/18

## 2021-10-17 ENCOUNTER — Other Ambulatory Visit: Payer: Self-pay | Admitting: Cardiology

## 2021-10-18 ENCOUNTER — Telehealth: Payer: Self-pay | Admitting: Emergency Medicine

## 2021-10-18 NOTE — Telephone Encounter (Signed)
Dr Lamonte Sakai,  Something's have gotten mixed up for this patient. She told us last week that her dental office was faxing over a request last week for dental work. She is to have dental work today 9/18 for several extractions and scaling and root lining.   Pt is on Eliquis and needs clearance. She is highly upset and yelling on the phone.   Please advise

## 2021-10-18 NOTE — Telephone Encounter (Signed)
There are no contraindications for her dental procedure with regard to her pulmonary status, but she will need to be off of her Eliquis. As noted in my original response to message regarding this issue >> I recommend that she be off at least 2 days prior to procedure, but THE FINAL WORD RESTS WITH THE DENTIST regarding how long they want her off the medication, and when she can restart it. I am fine with her being off Eliquis for 2 days, or as long as the dentist needs it to be held.

## 2021-10-18 NOTE — Telephone Encounter (Signed)
Faxed over recommendations from Dr Lamonte Sakai. Nothing further needed

## 2021-11-05 ENCOUNTER — Encounter: Payer: BC Managed Care – PPO | Admitting: Medical-Surgical

## 2021-11-24 ENCOUNTER — Other Ambulatory Visit: Payer: Self-pay

## 2021-11-24 ENCOUNTER — Other Ambulatory Visit: Payer: Self-pay | Admitting: Medical-Surgical

## 2021-11-24 DIAGNOSIS — M25562 Pain in left knee: Secondary | ICD-10-CM | POA: Diagnosis not present

## 2021-11-24 DIAGNOSIS — M170 Bilateral primary osteoarthritis of knee: Secondary | ICD-10-CM | POA: Diagnosis not present

## 2021-11-24 DIAGNOSIS — M25561 Pain in right knee: Secondary | ICD-10-CM | POA: Diagnosis not present

## 2021-11-24 NOTE — Telephone Encounter (Signed)
Patient needs an appointment for further refills.  Last office visit 02/26/2021  Last filled 02/26/2021  Sent 30 day supply to the pharmacy. No Refills.

## 2021-11-24 NOTE — Telephone Encounter (Signed)
Called patient and LVM stating patient needs to schedule in order to receive further refills. Alyssa [name]

## 2021-11-26 ENCOUNTER — Ambulatory Visit: Payer: BC Managed Care – PPO | Admitting: Cardiology

## 2021-11-26 ENCOUNTER — Encounter: Payer: Self-pay | Admitting: Cardiology

## 2021-11-26 ENCOUNTER — Ambulatory Visit: Payer: BC Managed Care – PPO | Attending: Cardiology | Admitting: Cardiology

## 2021-11-26 VITALS — BP 130/78 | HR 88 | Ht 66.0 in | Wt 280.0 lb

## 2021-11-26 DIAGNOSIS — Z7901 Long term (current) use of anticoagulants: Secondary | ICD-10-CM

## 2021-11-26 DIAGNOSIS — I2699 Other pulmonary embolism without acute cor pulmonale: Secondary | ICD-10-CM | POA: Diagnosis not present

## 2021-11-26 DIAGNOSIS — E782 Mixed hyperlipidemia: Secondary | ICD-10-CM

## 2021-11-26 DIAGNOSIS — I10 Essential (primary) hypertension: Secondary | ICD-10-CM | POA: Diagnosis not present

## 2021-11-26 DIAGNOSIS — R0683 Snoring: Secondary | ICD-10-CM

## 2021-11-26 NOTE — Progress Notes (Unsigned)
Cardiology Office Note:    Date:  11/26/2021   ID:  Alyssa [name], DOB 05/26/1960, MRN [id number]  PCP:  Samuel Bouche, NP  Cardiologist:  Jenne Campus, MD    Referring MD: Samuel Bouche, NP   Chief Complaint  Patient presents with   Follow-up  Doing fine  History of Present Illness:    Alyssa [name] is a 61 y.o. female with past medical history significant for essential hypertension, history of DVT and PE, she is anticoagulated long-term, dyslipidemia, borderline diabetes.  She comes today to my office for follow-up seems to be doing. Blood pressures under control.  She tripped and injured her left knee.  She did see some doctor fluids but no resolution for this problem yet.  Cardiac wise doing well denies having any chest pain tightness squeezing pressure mid chest no shortness of breath.  Trying to be more active however because of knee injury which is problematic. Past Medical History:  Diagnosis Date   Anxiety 05/2018   Arthritis    Asthma    Costochondritis    DVT (deep venous thrombosis) (HCC)    GERD (gastroesophageal reflux disease)    Hypertension    Pulmonary emboli (HCC)     Past Surgical History:  Procedure Laterality Date   CESAREAN SECTION     CHOLECYSTECTOMY     KNEE ARTHROSCOPY W/ MENISCAL REPAIR Left    LUMBAR DISC ARTHROPLASTY     SPINE SURGERY  2006    Current Medications: Current Meds  Medication Sig   acetaminophen (TYLENOL) 325 MG tablet Take 2 tablets (650 mg total) by mouth every 6 (six) hours as needed for mild pain or headache.   apixaban (ELIQUIS) 2.5 MG TABS tablet Take 1 tablet (2.5 mg total) by mouth 2 (two) times daily.   fexofenadine (ALLEGRA) 180 MG tablet Take 1 tablet (180 mg total) by mouth daily.   fluticasone (FLONASE) 50 MCG/ACT nasal spray SPRAY 2 SPRAYS INTO EACH NOSTRIL EVERY DAY (Patient taking differently: Place 2 sprays into both nostrils daily.)   metoprolol succinate (TOPROL-XL) 50 MG 24 hr tablet Take 1 tablet  (50 mg total) by mouth daily.   valsartan (DIOVAN) 80 MG tablet Take 1 tablet (80 mg total) by mouth 2 (two) times daily.   [DISCONTINUED] Azelastine HCl 137 MCG/SPRAY SOLN Place 1 spray into both nostrils 2 (two) times daily as needed (nasal congestion).   [DISCONTINUED] traZODone (DESYREL) 50 MG tablet Take 0.5-1 tablets (25-50 mg total) by mouth at bedtime as needed. for sleep. NEEDS APPOINTMENT FOR FURTHER REFILLS. (Patient taking differently: Take 25-50 mg by mouth at bedtime as needed for sleep. for sleep. NEEDS APPOINTMENT FOR FURTHER REFILLS.)     Allergies:   Sulfa antibiotics, Albuterol sulfate, and Lactose intolerance (gi)   Social History   Socioeconomic History   Marital status: Single    Spouse name: Not on file   Number of children: Not on file   Years of education: Not on file   Highest education level: Not on file  Occupational History   Not on file  Tobacco Use   Smoking status: Never   Smokeless tobacco: Never   Tobacco comments:    Exposure to second hand smoke  Substance and Sexual Activity   Alcohol use: Never   Drug use: Never   Sexual activity: Not Currently    Birth control/protection: None  Other Topics Concern   Not on file  Social History Narrative   Not on file   Social Determinants of  Health   Financial Resource Strain: Not on file  Food Insecurity: Not on file  Transportation Needs: Not on file  Physical Activity: Not on file  Stress: Not on file  Social Connections: Not on file     Family History: The patient's family history includes Congestive Heart Failure in her mother; Heart disease in her brother and mother; Hypertension in her mother; Stroke in her mother. ROS:   Please see the history of present illness.    All 14 point review of systems negative except as described per history of present illness  EKGs/Labs/Other Studies Reviewed:      Recent Labs: 11/27/2020: ALT 11 02/19/2021: B Natriuretic Peptide 27.6 05/24/2021: BUN  14; Creatinine, Ser 0.93; Hemoglobin 12.1; Platelets 259.0; Potassium 4.1; Pro B Natriuretic peptide (BNP) 96.0; Sodium 141  Recent Lipid Panel    Component Value Date/Time   CHOL 144 01/26/2021 0943   TRIG 115 01/26/2021 0943   HDL 39 (L) 01/26/2021 0943   CHOLHDL 3.7 01/26/2021 0943   LDLCALC 84 01/26/2021 0943    Physical Exam:    VS:  BP 130/78 (BP Location: Left Arm, Patient Position: Sitting)   Pulse 88   Ht '5\' 6"'$  (1.676 m)   Wt 280 lb (127 kg)   SpO2 94%   BMI 45.19 kg/m     Wt Readings from Last 3 Encounters:  11/26/21 280 lb (127 kg)  05/25/21 282 lb 3.2 oz (128 kg)  05/24/21 282 lb 3.2 oz (128 kg)     GEN:  Well nourished, well developed in no acute distress HEENT: Normal NECK: No JVD; No carotid bruits LYMPHATICS: No lymphadenopathy CARDIAC: RRR, no murmurs, no rubs, no gallops RESPIRATORY:  Clear to auscultation without rales, wheezing or rhonchi  ABDOMEN: Soft, non-tender, non-distended MUSCULOSKELETAL:  No edema; No deformity  SKIN: Warm and dry LOWER EXTREMITIES: no swelling NEUROLOGIC:  Alert and oriented x 3 PSYCHIATRIC:  Normal affect   ASSESSMENT:    1. Pulmonary embolism on long-term anticoagulation therapy ([place])   2. Primary hypertension   3. Hyperlipidemia, mixed   4. Morbid obesity ([place])    PLAN:    In order of problems listed above:  Pulmonary emboli history of.  Continue anticoagulation.  Hypertension finally we have blood pressure under control continue present management This edema I did review her K PN which show me her LDL of 84 HDL 39.  We will continue present management which include diet and exercise. Knee injury.  She is scheduled to see orthopedic doctor for it. Obesity she was told that she needs to lose some weight before surgery for knee can be done she is planning to her lose 35 pounds.  And we talk about techniques how to do that   Medication Adjustments/Labs and Tests Ordered: Current medicines are reviewed at length  with the patient today.  Concerns regarding medicines are outlined above.  No orders of the defined types were placed in this encounter.  Medication changes: No orders of the defined types were placed in this encounter.   Signed, Park Liter, MD, [facility] 11/26/2021 [facility]

## 2021-11-26 NOTE — Patient Instructions (Addendum)
Medication Instructions:  Your physician recommends that you continue on your current medications as directed. Please refer to the Current Medication list given to you today.  *If you need a refill on your cardiac medications before your next appointment, please call your pharmacy*   Lab Work: None Ordered If you have labs (blood work) drawn today and your tests are completely normal, you will receive your results only by: [place] (if you have MyChart) OR A paper copy in the mail If you have any lab test that is abnormal or we need to change your treatment, we will call you to review the results.   Testing/Procedures: None Ordered   Follow-Up: At [facility], you and your health needs are our priority.  As part of our continuing mission to provide you with exceptional heart care, we have created designated Provider Care Teams.  These Care Teams include your primary Cardiologist (physician) and Advanced Practice Providers (APPs -  Physician Assistants and Nurse Practitioners) who all work together to provide you with the care you need, when you need it.  We recommend signing up for the patient portal called "MyChart".  Sign up information is provided on this After Visit Summary.  MyChart is used to connect with patients for Virtual Visits (Telemedicine).  Patients are able to view lab/test results, encounter notes, upcoming appointments, etc.  Non-urgent messages can be sent to your provider as well.   To learn more about what you can do with MyChart, go to [email or web address].    Your next appointment:   6 month(s)  The format for your next appointment:   In Person  Provider:   Jenne Campus, MD    Other Instructions Itamar Sleep Study- will call when approved

## 2021-11-29 ENCOUNTER — Telehealth: Payer: Self-pay | Admitting: *Deleted

## 2021-11-29 ENCOUNTER — Telehealth: Payer: BC Managed Care – PPO | Admitting: Medical-Surgical

## 2021-11-29 NOTE — Telephone Encounter (Signed)
Prior Authorization for [employer] sent to [employer] via web portal. Approval Number [id number].  Valid dates 11/29/21 to 01/27/22.

## 2021-12-01 NOTE — Telephone Encounter (Signed)
Opened in error.   Will close encounter.  

## 2021-12-02 ENCOUNTER — Telehealth: Payer: BC Managed Care – PPO | Admitting: Medical-Surgical

## 2021-12-02 ENCOUNTER — Telehealth: Payer: Self-pay

## 2021-12-02 NOTE — Telephone Encounter (Signed)
Alyssa [name] approved, patient aware ok to pick up device. Must sign consent.

## 2021-12-16 DIAGNOSIS — M170 Bilateral primary osteoarthritis of knee: Secondary | ICD-10-CM | POA: Diagnosis not present

## 2021-12-17 ENCOUNTER — Ambulatory Visit: Payer: BC Managed Care – PPO | Admitting: Adult Health

## 2021-12-31 ENCOUNTER — Encounter (HOSPITAL_BASED_OUTPATIENT_CLINIC_OR_DEPARTMENT_OTHER): Payer: BC Managed Care – PPO | Admitting: Cardiology

## 2021-12-31 DIAGNOSIS — G4733 Obstructive sleep apnea (adult) (pediatric): Secondary | ICD-10-CM

## 2022-01-03 ENCOUNTER — Encounter: Payer: Self-pay | Admitting: Cardiology

## 2022-01-03 ENCOUNTER — Ambulatory Visit: Payer: BC Managed Care – PPO | Attending: Cardiology

## 2022-01-03 DIAGNOSIS — R0683 Snoring: Secondary | ICD-10-CM

## 2022-01-03 NOTE — Procedures (Signed)
   SLEEP STUDY REPORT Patient Information Study Date: 12/31/2021 Patient Name: Alyssa [name] Patient ID: [id number] Birth Date: May 26, 1960 Age: 61 Gender: Female BMI: 45.0 (W=280 lb, H=5' 6'') Referring Physician: Jenne Campus, MD  TEST DESCRIPTION: Home sleep apnea testing was completed using the WatchPat, a Type 1 device, utilizing peripheral arterial tonometry (PAT), chest movement, actigraphy, pulse oximetry, pulse rate, body position and snore. AHI was calculated with apnea and hypopnea using valid sleep time as the denominator. RDI includes apneas, hypopneas, and RERAs. The data acquired and the scoring of sleep and all associated events were performed in accordance with the recommended standards and specifications as outlined in the AASM Manual for the Scoring of Sleep and Associated Events 2.2.0 (2015).   FINDINGS:   1. Mild Obstructive Sleep Apnea with AHI 7.6/hr.   2. No Central Sleep Apnea with pAHIc 0.3/hr.   3. Oxygen desaturations as low as 89%.   4. Severe snoring was present. O2 sats were < 88% for 0 min.   5. Total sleep time was 7 hrs and 7 min.   6. 18.6% of total sleep time was spent in REM sleep.   7. Shortened sleep onset latency at 6 min.   8. Shortened REM sleep onset latency at 59 min.   9. Total awakenings were 3.  10. Arrhythmia detection: None.  DIAGNOSIS: Mild Obstructive Sleep Apnea (G47.33)  RECOMMENDATIONS:   1.  Clinical correlation of these findings is necessary.  The decision to treat obstructive sleep apnea (OSA) is usually based on the presence of apnea symptoms or the presence of associated medical conditions such as Hypertension, Congestive Heart Failure, Atrial Fibrillation or Obesity.  The most common symptoms of OSA are snoring, gasping for breath while sleeping, daytime sleepiness and fatigue.   2.  Initiating apnea therapy is recommended given the presence of symptoms and/or associated conditions. Recommend proceeding with one of  the following:     a.  Auto-CPAP therapy with a pressure range of 5-20cm H2O.     b.  An oral appliance (OA) that can be obtained from certain dentists with expertise in sleep medicine.  These are primarily of use in non-obese patients with mild and moderate disease.     c.  An ENT consultation which may be useful to look for specific causes of obstruction and possible treatment options.     d.  If patient is intolerant to PAP therapy, consider referral to ENT for evaluation for hypoglossal nerve stimulator.   3.  Close follow-up is necessary to ensure success with CPAP or oral appliance therapy for maximum benefit.  4.  A follow-up oximetry study on CPAP is recommended to assess the adequacy of therapy and determine the need for supplemental oxygen or the potential need for Bi-level therapy.  An arterial blood gas to determine the adequacy of baseline ventilation and oxygenation should also be considered.  5.  Healthy sleep recommendations include:  adequate nightly sleep (normal 7-9 hrs/night), avoidance of caffeine after noon and alcohol near bedtime, and maintaining a sleep environment that is cool, dark and quiet.  6.  Weight loss for overweight patients is recommended.  Even modest amounts of weight loss can significantly improve the severity of sleep apnea.  7.  Snoring recommendations include:  weight loss where appropriate, side sleeping, and avoidance of alcohol before bed.  8.  Operation of motor vehicle should be avoided when sleepy.  Signature: Fransico Him, MD; [facility]; [place], [place] Board of Sleep Medicine Electronically Signed: 01/03/2022

## 2022-01-04 ENCOUNTER — Encounter: Payer: Self-pay | Admitting: Cardiology

## 2022-01-04 NOTE — Telephone Encounter (Signed)
error 

## 2022-01-05 ENCOUNTER — Telehealth: Payer: Self-pay | Admitting: *Deleted

## 2022-01-05 NOTE — Telephone Encounter (Signed)
-----   Message from Sueanne Margarita, MD sent at 01/03/2022  5:02 PM EST ----- Patient has very mild OSA - set up OV to discuss treatment options.

## 2022-01-05 NOTE — Telephone Encounter (Signed)
Left message to return a call to discuss sleep study results and MD recommendations.

## 2022-01-06 ENCOUNTER — Telehealth: Payer: Self-pay | Admitting: *Deleted

## 2022-01-06 NOTE — Telephone Encounter (Signed)
-----   Message from Sueanne Margarita, MD sent at 01/03/2022  5:02 PM EST ----- Patient has very mild OSA - set up OV to discuss treatment options.

## 2022-01-06 NOTE — Telephone Encounter (Signed)
The patient has been notified of the result. Left detailed message on voicemail and informed patient to call back..Alyssa [name], CMA   

## 2022-01-11 NOTE — Telephone Encounter (Signed)
The patient has been notified of the result and verbalized understanding.   Patient was very unhappy when she answered the phone from a previous conversation she had with someone else but she did not mention who that someone was. She was upset that Dr Radford Pax did not have any available appointments until the middle of January.She stated that was unacceptable for her and asked why did Dr Radford Pax read her study if she did not have any availability to see her this year.  She also complained someone was suppose to call her back that day or have someone to call her back that day or she was going to call the sate and complain and since she did not get a call back that day she said she called the state and complained She also asked me what took me so long to call her. I explained that my hand touches many different pieces of this work and I was going down the list of several patients until I got to her name. She then felt like I was making excuses for not calling her sooner and she promised me (" because she is a woman of her word")she was going to call the state and complain about our organization and the doctor that read the study. She asked for my supervisors name and number I gave it to her and she hung up the phone. Marolyn Hammock, [place] 01/11/2022 1:29 PM

## 2022-01-12 NOTE — Telephone Encounter (Addendum)
Late entry: The patient has been notified of the result and verbalized understanding.   Patient was very unhappy when she answered the phone from a previous conversation she had with someone else but she did not mention who that someone was. She was upset that Dr Radford Pax did not have any available appointments until the middle of January.She stated that was unacceptable for her and asked why did Dr Radford Pax read her study if she did not have any availability to see her this year.  She also complained someone was suppose to call her back that day or have someone to call her back that day or she was going to call the sate and complain and since she did not get a call back that day she said she called the state and complained She also asked me what took me so long to call her. I explained that my hand touches many different pieces of this work and I was going down the list of several patients until I got to her name. She then felt like I was making excuses for not calling her sooner and she promised me (" because she is a woman of her word")she was going to call the state and complain about our organization and the doctor that read the study. She asked for my supervisors name and number I gave it to her and she hung up the phone. Alyssa [name], [place] 01/11/2022 1:29 PM

## 2022-01-13 ENCOUNTER — Ambulatory Visit: Payer: BC Managed Care – PPO | Admitting: Cardiology

## 2022-01-18 ENCOUNTER — Ambulatory Visit: Payer: BC Managed Care – PPO | Admitting: Cardiology

## 2022-01-20 ENCOUNTER — Ambulatory Visit (INDEPENDENT_AMBULATORY_CARE_PROVIDER_SITE_OTHER): Payer: BC Managed Care – PPO | Admitting: Emergency Medicine

## 2022-01-20 ENCOUNTER — Encounter: Payer: Self-pay | Admitting: Emergency Medicine

## 2022-01-20 ENCOUNTER — Encounter: Payer: BC Managed Care – PPO | Admitting: Cardiology

## 2022-01-20 VITALS — BP 132/78 | HR 69 | Temp 98.5°F | Ht 64.0 in | Wt 282.0 lb

## 2022-01-20 DIAGNOSIS — I2699 Other pulmonary embolism without acute cor pulmonale: Secondary | ICD-10-CM

## 2022-01-20 DIAGNOSIS — Z2911 Encounter for prophylactic immunotherapy for respiratory syncytial virus (RSV): Secondary | ICD-10-CM

## 2022-01-20 DIAGNOSIS — G4733 Obstructive sleep apnea (adult) (pediatric): Secondary | ICD-10-CM | POA: Diagnosis not present

## 2022-01-20 DIAGNOSIS — J4520 Mild intermittent asthma, uncomplicated: Secondary | ICD-10-CM | POA: Diagnosis not present

## 2022-01-20 DIAGNOSIS — Z7901 Long term (current) use of anticoagulants: Secondary | ICD-10-CM

## 2022-01-20 DIAGNOSIS — Z23 Encounter for immunization: Secondary | ICD-10-CM | POA: Diagnosis not present

## 2022-01-20 HISTORY — DX: Obstructive sleep apnea (adult) (pediatric): G47.33

## 2022-01-20 NOTE — Progress Notes (Signed)
This encounter was created in error - please disregard.

## 2022-01-20 NOTE — Assessment & Plan Note (Signed)
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  Her headaches seem to be more related to sinusitis and sinus congestion (treated) than a side effect from the medication.  Plan to continue Eliquis

## 2022-01-20 NOTE — Patient Instructions (Addendum)
We reviewed the sleep study today.  We will try starting CPAP to treat mild sleep apnea. Continue Eliquis twice a day as you have been taking it Continue your fluticasone nasal spray every day as you have been taking it Try taking your Allegra every day to see if this helps your congestion and sinuses We will give you the RSV and flu vaccines today Follow Dr. Lamonte Sakai in 3 months so we can review your status on CPAP

## 2022-01-20 NOTE — Progress Notes (Signed)
   Subjective:    Patient ID: Alyssa [name], female    DOB: 12-06-1960, 61 y.o.   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  She has longstanding history of asthma although her pulmonary function testing does not support obstruction.  She has been seen for dyspnea and chronic cough.  She had flaring cough symptoms 1 month ago and was started on Allegra, restarted Astelin nasal spray.  She had been started on Spiriva which was stopped. Today she reports that she is doing better. Her cough is improved, rhinitis improved. Never uses albuterol. She does still have exertional SOB, improved from October PE. She has a family hx of PE.   ROV 01/20/22 --61 year old woman.  With history of VTE, DVT and PE in 10/2020, question provoked by immobility due to arthritis.  She was treated with Xarelto, then transition to prophylactic dose Eliquis in March.  She was having associated headaches, followed appear with TP to discuss - may have been sinuses.  Also some question of allergic asthma.  Pulmonary function testing is reassuring.  She does have albuterol available to use if needed.  She is interested in getting the RSV and flu shots today.  She underwent a sleep study 01/04/2022 that showed mild obstructive sleep apnea AHI 7.6/h without any central apneas.  Minimal desaturation (89%). She is on flonase on schedule and allegra.   Today she reports that her sinus sx are improved. She is tolerating the Eliquis.  Sometimes sleepy during the day. Will nap on Sundays. Sleep is not restorative.   Review of Systems As per HPI     Objective:   Physical Exam Vitals:   01/20/22 0826  BP: 132/78  Pulse: 69  Temp: 98.5 F (36.9 C)  TempSrc: Oral  SpO2: 99%  Weight: 282 lb (127.9 kg)  Height: '5\' 4"'$  (1.626 m)   Gen: Pleasant, obese woman, in no distress,  normal affect  ENT: No lesions,  mouth clear,  oropharynx  clear, no postnasal drip  Neck: No JVD, no stridor  Lungs: No use of accessory muscles, decreased at both bases, no crackles or wheezing on normal respiration, no wheeze on forced expiration  Cardiovascular: RRR, heart sounds normal, no murmur or gallops, no peripheral edema  Musculoskeletal: No deformities, no cyanosis or clubbing  Neuro: alert, awake, non focal  Skin: Warm, no lesions or rash      Assessment & Plan:   Pulmonary embolism on long-term anticoagulation therapy (HCC) Provoked pulmonary embolism, 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  Her headaches seem to be more related to sinusitis and sinus congestion (treated) than a side effect from the medication.  Plan to continue Eliquis  Mild intermittent asthma without complication Pulmonary function testing reassuring.  She does have albuterol available to use if needed.  No signs of bronchospasm.  She rarely uses the albuterol.  OSA (obstructive sleep apnea) Mild OSA documented on sleep study done 01/04/2022.  She is willing to try starting CPAP therapy.  We will order today, range 8-20 cmH2O.  Follow-up in about 3 months to confirm that she has received, assess and document compliance   Baltazar Apo, MD, PhD 01/20/2022, 2:28 PM [facility] [phone] or if no answer before 7:00PM call [phone] For any issues after 7:00PM please call eLink [phone]

## 2022-01-20 NOTE — Assessment & Plan Note (Signed)
Mild OSA documented on sleep study done 01/04/2022.  She is willing to try starting CPAP therapy.  We will order today, range 8-20 cmH2O.  Follow-up in about 3 months to confirm that she has received, assess and document compliance

## 2022-01-20 NOTE — Assessment & Plan Note (Signed)
Pulmonary function testing reassuring.  She does have albuterol available to use if needed.  No signs of bronchospasm.  She rarely uses the albuterol.

## 2022-01-26 ENCOUNTER — Other Ambulatory Visit: Payer: Self-pay | Admitting: *Deleted

## 2022-01-26 ENCOUNTER — Telehealth: Payer: Self-pay

## 2022-01-26 DIAGNOSIS — R058 Other specified cough: Secondary | ICD-10-CM

## 2022-01-26 DIAGNOSIS — J302 Other seasonal allergic rhinitis: Secondary | ICD-10-CM

## 2022-01-26 MED ORDER — FEXOFENADINE HCL 180 MG PO TABS: 180.0000 mg | ORAL_TABLET | Freq: Every day | ORAL | 3 refills | Status: DC

## 2022-01-26 NOTE — Telephone Encounter (Signed)
Called to check that patient did not need records sent to her PCP for follow up on her sleep study, patient states she has already seen her PCP for follow up and does not need anything further from our practice.

## 2022-02-09 ENCOUNTER — Encounter: Payer: Self-pay | Admitting: Pulmonary Disease

## 2022-02-09 ENCOUNTER — Ambulatory Visit (INDEPENDENT_AMBULATORY_CARE_PROVIDER_SITE_OTHER): Payer: BLUE CROSS/BLUE SHIELD | Admitting: Pulmonary Disease

## 2022-02-09 VITALS — BP 138/94 | HR 80 | Ht 66.0 in | Wt 284.0 lb

## 2022-02-09 DIAGNOSIS — R0602 Shortness of breath: Secondary | ICD-10-CM

## 2022-02-09 MED ORDER — PREDNISONE 20 MG PO TABS: 20.0000 mg | ORAL_TABLET | Freq: Every day | ORAL | 0 refills | Status: DC

## 2022-02-09 MED ORDER — AZITHROMYCIN 250 MG PO TABS: ORAL_TABLET | ORAL | 0 refills | Status: DC

## 2022-02-09 NOTE — Patient Instructions (Signed)
Course of azithromycin Course of prednisone  Continue your Eliquis  Keep appointment with Dr. Lamonte Sakai in a couple of months   We can consider repeating the CAT scan if your shortness of breath still does not get better despite using a course of antibiotics and steroids It is very unlikely that you have a blood clot with you being compliant with your Eliquis

## 2022-02-09 NOTE — Progress Notes (Signed)
Alyssa [name]    [id number]    07/14/1960  Primary Care Physician:Jessup, Caryl Asp, NP  Referring Physician: Samuel Bouche, NP [address] [place],   [address]  Chief complaint:   Acute visit with cough, shortness of breath wheezing  HPI:  Patient of Dr. Agustina Caroli  History of asthma, rarely needs albuterol  Noticed cough, shortness of breath, wheezing Not been around anybody with a febrile illness  PE diagnosed in October 2022, has been on Eliquis Compliant with Eliquis with no missed doses recently  She does have some costochondritis which is associated with some chest discomfort   Outpatient Encounter Medications as of 02/09/2022  Medication Sig   acetaminophen (TYLENOL) 325 MG tablet Take 2 tablets (650 mg total) by mouth every 6 (six) hours as needed for mild pain or headache.   apixaban (ELIQUIS) 2.5 MG TABS tablet Take 1 tablet (2.5 mg total) by mouth 2 (two) times daily.   fexofenadine (ALLEGRA) 180 MG tablet Take 1 tablet (180 mg total) by mouth daily.   fluticasone (FLONASE) 50 MCG/ACT nasal spray SPRAY 2 SPRAYS INTO EACH NOSTRIL EVERY DAY (Patient taking differently: Place 2 sprays into both nostrils daily.)   metoprolol succinate (TOPROL-XL) 50 MG 24 hr tablet Take 1 tablet (50 mg total) by mouth daily.   valsartan (DIOVAN) 80 MG tablet Take 1 tablet (80 mg total) by mouth 2 (two) times daily.   No facility-administered encounter medications on file as of 02/09/2022.    Allergies as of 02/09/2022 - Review Complete 02/09/2022  Allergen Reaction Noted   Sulfa antibiotics Hives 09/03/2010   Albuterol sulfate Cough and Nausea And Vomiting 02/26/2021   Lactose intolerance (gi)  12/01/2020    Past Medical History:  Diagnosis Date   Anxiety 05/2018   Arthritis    Asthma    Costochondritis    DVT (deep venous thrombosis) (HCC)    GERD (gastroesophageal reflux disease)    Hypertension    OSA (obstructive sleep apnea) 01/20/2022    Pulmonary emboli (HCC)     Past Surgical History:  Procedure Laterality Date   CESAREAN SECTION     CHOLECYSTECTOMY     KNEE ARTHROSCOPY W/ MENISCAL REPAIR Left    LUMBAR [place] ARTHROPLASTY     SPINE SURGERY  2006    Family History  Problem Relation Age of Onset   Congestive Heart Failure Mother    Stroke Mother    Heart disease Mother    Hypertension Mother    Heart disease Brother        Pacemaker    Social History   Socioeconomic History   Marital status: Single    Spouse name: Not on file   Number of children: Not on file   Years of education: Not on file   Highest education level: Not on file  Occupational History   Not on file  Tobacco Use   Smoking status: Never   Smokeless tobacco: Never   Tobacco comments:    Exposure to second hand smoke  Substance and Sexual Activity   Alcohol use: Never   Drug use: Never   Sexual activity: Not Currently    Birth control/protection: None  Other Topics Concern   Not on file  Social History Narrative   Not on file   Social Determinants of Health   Financial Resource Strain: Not on file  Food Insecurity: Not on file  Transportation Needs: Not on file  Physical Activity:  Not on file  Stress: Not on file  Social Connections: Not on file  Intimate Partner Violence: Not on file    Review of Systems  Respiratory:  Positive for cough, shortness of breath and wheezing.     Vitals:   02/09/22 1019  BP: (!) 138/94  Pulse: 80  SpO2: 100%     Physical Exam Constitutional:      Appearance: She is obese.  HENT:     Head: Normocephalic.     Mouth/Throat:     Mouth: Mucous membranes are moist.  Eyes:     General: No scleral icterus. Cardiovascular:     Rate and Rhythm: Normal rate and regular rhythm.     Heart sounds: No murmur heard.    No friction rub.  Pulmonary:     Effort: No respiratory distress.     Breath sounds: No stridor. No wheezing or rhonchi.  Musculoskeletal:     Cervical back: No rigidity  or tenderness.  Neurological:     Mental Status: She is alert.  Psychiatric:        Mood and Affect: Mood normal.    Data Reviewed: Previous CT scans of the chest reviewed  Assessment:  Cough, wheezing, shortness of breath -Likely bronchitis  History of pulmonary embolism -Compliant with Eliquis  History of lung nodule -Follows up with Dr. Lamonte Sakai    Plan/Recommendations: Course of antibiotics-azithromycin and short course of steroids prednisone 20 mg daily for 7 days  Encouraged to call with significant concerns  Encouraged to continue to comply with Eliquis  If shortness of breath does not continue to improve then repeat CT may be considered at that time  Encouraged to keep appointment with Dr. Lamonte Sakai scheduled for March   Sherrilyn Rist MD [facility] 02/09/2022, 10:49 AM  CC: Samuel Bouche, NP

## 2022-02-15 ENCOUNTER — Encounter: Payer: Self-pay | Admitting: Pulmonary Disease

## 2022-02-15 ENCOUNTER — Encounter: Payer: Self-pay | Admitting: Emergency Medicine

## 2022-02-15 ENCOUNTER — Other Ambulatory Visit: Payer: Self-pay | Admitting: Pulmonary Disease

## 2022-02-15 MED ORDER — PREDNISONE 20 MG PO TABS: 20.0000 mg | ORAL_TABLET | Freq: Every day | ORAL | 0 refills | Status: DC

## 2022-02-15 NOTE — Telephone Encounter (Signed)
Received the following messages from patient:   " Dr Ander Slade I completed the meds you gave me. I was starting to feeling drained by Friday after work, so I spent most of the day in bed on Saturday. But then Sunday, I noticed a more proactive cough (since then I had felt congestion but nothing was coming up) but now a thick white phlegm. I feel achy, especially when I cough. I also have a headache that I am taking Tylenol for. I am making sure I am staying hydrated. But I have noticed an increase in my appetite.    So I am back to bed. I know the antibiotic will continue to work for the next few days. But what else should I be doing? Do I need another course of the steroids?   Also i work from home and can easily work from my bed, however I have a requirement to go into the office once a week - I feel like I will not make that 36 mile drive ([place] to [place]) with this bronchitis. So I am going to need done sort of doctors note please.   I hate to leave two messages before the first is answered. But I forgot to mention something important.    Sometimes when inhaling it hurts on my right side right below my breast. It is a quick sharp pain that makes me cut the breath short.  I don't really know what it is but it has occurred about 4-5 times on Monday. Almost like if I fall asleep and breathe deeply it hurts. "  AO, can you please advise? Thanks!

## 2022-02-15 NOTE — Telephone Encounter (Signed)
Called in more prednisone  Pended note routed to [place] that she was being treated for bronchitis

## 2022-02-16 NOTE — Telephone Encounter (Signed)
Difficult to characterize well without seeing her.  I am ok giving a more prolonged steroids taper, and doxycyline. She will need to be seen again as an acute visit by APP. OK to write a letter that she was sick and could not drive to work.   Pred > Take '40mg'$  daily for 3 days, then '30mg'$  daily for 3 days, then '20mg'$  daily for 3 days, then '10mg'$  daily for 3 days, then stop  Doxycycline '100mg'$  bid x 7 days

## 2022-02-17 ENCOUNTER — Encounter: Payer: Self-pay | Admitting: *Deleted

## 2022-02-17 MED ORDER — PREDNISONE 10 MG PO TABS: ORAL_TABLET | ORAL | 0 refills | Status: AC

## 2022-02-17 MED ORDER — DOXYCYCLINE HYCLATE 100 MG PO TABS: 100.0000 mg | ORAL_TABLET | Freq: Two times a day (BID) | ORAL | 0 refills | Status: DC

## 2022-02-21 ENCOUNTER — Ambulatory Visit (INDEPENDENT_AMBULATORY_CARE_PROVIDER_SITE_OTHER): Payer: BLUE CROSS/BLUE SHIELD

## 2022-02-21 ENCOUNTER — Encounter: Payer: Self-pay | Admitting: Primary Care

## 2022-02-21 ENCOUNTER — Ambulatory Visit (INDEPENDENT_AMBULATORY_CARE_PROVIDER_SITE_OTHER): Payer: BLUE CROSS/BLUE SHIELD | Admitting: Primary Care

## 2022-02-21 VITALS — BP 122/76 | HR 71 | Temp 97.8°F | Ht 66.0 in | Wt 284.0 lb

## 2022-02-21 DIAGNOSIS — J40 Bronchitis, not specified as acute or chronic: Secondary | ICD-10-CM | POA: Diagnosis not present

## 2022-02-21 DIAGNOSIS — J4521 Mild intermittent asthma with (acute) exacerbation: Secondary | ICD-10-CM

## 2022-02-21 MED ORDER — BENZONATATE 200 MG PO CAPS: 200.0000 mg | ORAL_CAPSULE | Freq: Three times a day (TID) | ORAL | 1 refills | Status: DC | PRN

## 2022-02-21 MED ORDER — BUDESONIDE-FORMOTEROL FUMARATE 80-4.5 MCG/ACT IN AERO: 2.0000 | INHALATION_SPRAY | Freq: Two times a day (BID) | RESPIRATORY_TRACT | 0 refills | Status: DC

## 2022-02-21 NOTE — Progress Notes (Signed)
$'@Patient'g$  ID: Alyssa [name], female    DOB: 11-Dec-1960, 62 y.o.   MRN: [id number]  Chief Complaint  Patient presents with   Acute Visit    Cough,wheezing,cough,Chest pain.     Referring provider: Samuel Bouche, NP  HPI: 62 year old female, never smoked.  Past medical history OSA, mild intermittent asthma, chronic bronchitis, hypertension, pulmonary embolism on anticoagulation, GERD  02/21/2022 Patient presents today for acute office visit due to bronchitis. She developed symptoms of  cough, chest tightness, wheezing and shortness of breath 1-2 weeks ago. He was initially given Zpack/prednisone and started course doxycyline and prednisone taper on 02/15/22. She is feeling a little bit better. She felt really well the first two days. She has a couple days left of abx and prednisone. Reports coughing up less mucus with white sputum. She is still having a lot of shortness of breath with associated wheezing and pleuritic chest pain. She has no pain when sitting still, occurs when she moves. If she talks too long she gets winded. She is not on maintenance inhaler. Typically asthma symptoms are well controlled.   Allergies  Allergen Reactions   Sulfa Antibiotics Hives   Albuterol Sulfate Cough and Nausea And Vomiting   Lactose Intolerance (Gi)     Immunization History  Administered Date(s) Administered   Influenza Inj Mdck Quad Pf 10/16/2015   Influenza Split 12/28/2012   Influenza,inj,Quad PF,6+ Mos 01/20/2022   Influenza,inj,quad, With Preservative 10/16/2015   Influenza-Unspecified 10/16/2015   PFIZER(Purple Top)SARS-COV-2 Vaccination 03/28/2019, 04/25/2019, 12/24/2019, 09/02/2020   PPD Test 10/14/2011   Pneumococcal Polysaccharide-23 08/28/2014   Respiratory Syncytial Virus Vaccine,Recomb Aduvanted(Arexvy) 01/20/2022   Tdap 11/26/2008    Past Medical History:  Diagnosis Date   Anxiety 05/2018   Arthritis    Asthma    Costochondritis    DVT (deep venous thrombosis) (HCC)     GERD (gastroesophageal reflux disease)    Hyperlipidemia, mixed 08/09/2017   Hypertension    OSA (obstructive sleep apnea) 01/20/2022   Pulmonary emboli (HCC)     Tobacco History: Social History   Tobacco Use  Smoking Status Never  Smokeless Tobacco Never  Tobacco Comments   Exposure to second hand smoke   Counseling given: Not Answered Tobacco comments: Exposure to second hand smoke   Outpatient Medications Prior to Visit  Medication Sig Dispense Refill   acetaminophen (TYLENOL) 325 MG tablet Take 2 tablets (650 mg total) by mouth every 6 (six) hours as needed for mild pain or headache.     apixaban (ELIQUIS) 2.5 MG TABS tablet Take 1 tablet (2.5 mg total) by mouth 2 (two) times daily. 60 tablet 11   doxycycline (VIBRA-TABS) 100 MG tablet Take 1 tablet (100 mg total) by mouth 2 (two) times daily. 14 tablet 0   fexofenadine (ALLEGRA) 180 MG tablet Take 1 tablet (180 mg total) by mouth daily. 90 tablet 3   fluticasone (FLONASE) 50 MCG/ACT nasal spray SPRAY 2 SPRAYS INTO EACH NOSTRIL EVERY DAY (Patient taking differently: Place 2 sprays into both nostrils daily.) 48 mL 1   metoprolol succinate (TOPROL-XL) 50 MG 24 hr tablet Take 1 tablet (50 mg total) by mouth daily. 90 tablet 1   predniSONE (DELTASONE) 10 MG tablet Take 4 tablets (40 mg total) by mouth daily with breakfast for 3 days, THEN 3 tablets (30 mg total) daily with breakfast for 3 days, THEN 2 tablets (20 mg total) daily with breakfast for 3 days, THEN 1 tablet (10 mg total) daily with breakfast for 3 days. [place]  tablet 0   valsartan (DIOVAN) 80 MG tablet Take 1 tablet (80 mg total) by mouth 2 (two) times daily. 180 tablet 3   azithromycin (ZITHROMAX Z-PAK) 250 MG tablet Take 2 tablets day 1 and then 1 daily for 4 days (Patient not taking: Reported on 02/21/2022) 6 each 0   No facility-administered medications prior to visit.      Review of Systems  Review of Systems  Constitutional: Negative.   HENT: Negative.   Negative for postnasal drip.   Respiratory:  Positive for cough, chest tightness and shortness of breath.   Cardiovascular: Negative.      Physical Exam  BP 122/76 (BP Location: Right Arm, Patient Position: Sitting, Cuff Size: Large)   Pulse 71   Temp 97.8 F (36.6 C) (Oral)   Ht '5\' 6"'$  (1.676 m)   Wt 284 lb (128.8 kg)   SpO2 100%   BMI 45.84 kg/m  Physical Exam Constitutional:      Appearance: Normal appearance.  HENT:     Head: Normocephalic and atraumatic.     Mouth/Throat:     Mouth: Mucous membranes are moist.     Pharynx: Oropharynx is clear.  Cardiovascular:     Rate and Rhythm: Normal rate and regular rhythm.  Pulmonary:     Effort: Pulmonary effort is normal.     Breath sounds: No wheezing, rhonchi or rales.     Comments: Reactive cough Musculoskeletal:        General: Normal range of motion.  Skin:    General: Skin is warm and dry.  Neurological:     General: No focal deficit present.     Mental Status: She is alert and oriented to person, place, and time. Mental status is at baseline.  Psychiatric:        Mood and Affect: Mood normal.        Behavior: Behavior normal.        Thought Content: Thought content normal.        Judgment: Judgment normal.      Lab Results:  CBC    Component Value Date/Time   WBC 8.2 05/24/2021 1455   RBC 4.94 05/24/2021 1455   HGB 12.1 05/24/2021 1455   HCT 38.0 05/24/2021 1455   PLT 259.0 05/24/2021 1455   MCV 76.8 (L) 05/24/2021 1455   MCH 24.7 (L) 02/19/2021 2034   MCHC 32.0 05/24/2021 1455   RDW 17.0 (H) 05/24/2021 1455   LYMPHSABS 1.4 05/24/2021 1455   MONOABS 0.6 05/24/2021 1455   EOSABS 0.0 05/24/2021 1455   BASOSABS 0.0 05/24/2021 1455    BMET    Component Value Date/Time   NA 141 05/24/2021 1455   NA 145 (H) 02/25/2021 1133   K 4.1 05/24/2021 1455   CL 107 05/24/2021 1455   CO2 25 05/24/2021 1455   GLUCOSE 103 (H) 05/24/2021 1455   BUN 14 05/24/2021 1455   BUN 11 02/25/2021 1133   CREATININE  0.93 05/24/2021 1455   CALCIUM 9.2 05/24/2021 1455   GFRNONAA >60 02/19/2021 2034    BNP    Component Value Date/Time   BNP 27.6 02/19/2021 2034    ProBNP    Component Value Date/Time   PROBNP 96.0 05/24/2021 1455    Imaging: No results found.   Assessment & Plan:   Asthmatic bronchitis with exacerbation, mild intermittent - Asthma symptoms are typically well controlled. Acutely exacerbated d/t URI and cold weather.  - Lungs clear on exam, she has a reactive cough -  Currently on doxycycline '100mg'$  BID and prednisone taper  - Given Albuterol nebulizer x 1 in office with some improvement - Sending in RX Symbicort 65mg two puffs twice daily until better and tessalon perles 1 capsule TID for cough suppression - Checking CXR   EMartyn Ehrich NP 02/21/2022

## 2022-02-21 NOTE — Assessment & Plan Note (Addendum)
-  Asthma symptoms are typically well controlled. Acutely exacerbated d/t URI and cold weather.  - Lungs clear on exam, she has a reactive cough - Currently on doxycycline '100mg'$  BID and prednisone taper  - Given Albuterol nebulizer x 1 in office with some improvement - Sending in RX Symbicort 20mg two puffs twice daily until better and tessalon perles 1 capsule TID for cough suppression - Checking CXR

## 2022-02-21 NOTE — Progress Notes (Signed)
CXR was negative for acute process. She has degenerative changes of the spine and shoulder which could be causing her pain.

## 2022-02-21 NOTE — Patient Instructions (Addendum)
Recommendations:  Start inhaler called Symbicort- take two puffs morning and evening (use with spacer) Start Delsym cough syrup morning and evening  Take Tessalon Perles every 8 hours as needed for cough suppression  Orders Albuterol nebulizer in office Chest x-ray today (ordered)  Rx: Symbicort 2 puffs twice daily (sent)  Follow-up: 2 weeks if not better

## 2022-02-23 NOTE — Progress Notes (Signed)
error 

## 2022-02-25 MED ORDER — ALBUTEROL SULFATE (2.5 MG/3ML) 0.083% IN NEBU: INHALATION_SOLUTION | RESPIRATORY_TRACT | 12 refills | Status: AC

## 2022-02-25 NOTE — Telephone Encounter (Signed)
Mychart message sent by pt: Alyssa [name] [facility] (Basile, NP)3 hours ago (6:16 AM)    Nurse Volanda Napoleon I am feeling better but no where near 100%. Not even close. I do have some less pain but the cough and associated congestion and fatigue concern me still. I think the Simbicort is helping but it doesn't give the push that the nebulizer albuterol  did in the office. After the albuterol, I felt more open and had very productive coughs. It felt like things were moving along and out - and surprisingly it didn't make me nauseous at all.  Is this a treatment I can do at home?  I can't inhale albuterol through the inhaler, it makes me vomit but perhaps using the spacer or a nebulizer will help get it in.  What do you think?      [name], please advise.

## 2022-02-25 NOTE — Telephone Encounter (Signed)
We can either give her a spacer or place DME order for nebulizer to use at home along with Albuterol medication to use every 4-6 hours as needed for shortness of breath/cough

## 2022-03-07 ENCOUNTER — Encounter: Payer: Self-pay | Admitting: Emergency Medicine

## 2022-03-07 MED ORDER — PREDNISONE 10 MG PO TABS: ORAL_TABLET | ORAL | 0 refills | Status: AC

## 2022-03-07 MED ORDER — PROMETHAZINE-DM 6.25-15 MG/5ML PO SYRP: 5.0000 mL | ORAL_SOLUTION | Freq: Four times a day (QID) | ORAL | 0 refills | Status: DC | PRN

## 2022-03-07 NOTE — Telephone Encounter (Signed)
We can repeat prednisone with a taper. (10 mg tab) 4 tabs for 3 days, then 3 tabs for 3 days, 2 tabs for 3 days, then 1 tab for 3 days, then stop. Take in AM with food. She needs to work on suppressing the cough to calm inflammation. Use benzonatate capsules 200 mg Three times a day for the next 3-4 days to suppress the cough. Ok to send promethazine DM cough syrup 5 mL every 6 hours as needed for cough. Do not drive after taking as it may cause drowsiness. Switch from mucinex DM to plain guaifenesin while using the cough syrup. No need for further abx at this point as she completed doxy course and had a clear CXR. Will need a OV if no improvement.

## 2022-03-07 NOTE — Telephone Encounter (Signed)
Now routing to [facility] for review. Please advise.

## 2022-03-07 NOTE — Telephone Encounter (Signed)
Mychart message sent by pt:  Alyssa [name] [facility] (supporting Collene Gobble, MD)7 minutes ago (1:40 PM)    I have finished all my bronchitis meds - the antibiotic and the steroids. I have some energy and less shortness of breath even though both are still not functioning at 100%. If I am up and moving about I cough a lot and have some wheezing. The pain in my chest supposed about 4 days ago, as did the pain in my back. However the pain in my back has returned. It is most definitely connected with the bronchitis and not with any arthritic condition.  I am still taking the albuterol twice a day as well as the Symbicort, along with Musinex DM. Marland Kitchen I don't know if things are clearing up or winding back up.  I don't have a fever, mucus is primarily white, chest feels sore when coughing, and back hurts when coughing.      Dr. Lamonte Sakai, please advise.

## 2022-03-08 ENCOUNTER — Other Ambulatory Visit: Payer: Self-pay | Admitting: Emergency Medicine

## 2022-03-08 ENCOUNTER — Other Ambulatory Visit: Payer: Self-pay | Admitting: Cardiology

## 2022-03-08 NOTE — Telephone Encounter (Signed)
I don't have any reason to ask her to stay at home, as long as she feels up to exerting herself, going out.  I don't see that she had COVID or any clear indication to quarantine.

## 2022-03-15 ENCOUNTER — Encounter: Payer: Self-pay | Admitting: Student

## 2022-03-15 ENCOUNTER — Ambulatory Visit (INDEPENDENT_AMBULATORY_CARE_PROVIDER_SITE_OTHER): Payer: BLUE CROSS/BLUE SHIELD | Admitting: Student

## 2022-03-15 VITALS — BP 134/74 | HR 98 | Temp 98.4°F | Ht 66.0 in | Wt 285.4 lb

## 2022-03-15 DIAGNOSIS — R0600 Dyspnea, unspecified: Secondary | ICD-10-CM | POA: Diagnosis not present

## 2022-03-15 LAB — CBC WITH DIFFERENTIAL/PLATELET
Basophils Absolute: 0 10*3/uL (ref 0.0–0.1)
Basophils Relative: 0.1 % (ref 0.0–3.0)
Eosinophils Absolute: 0 10*3/uL (ref 0.0–0.7)
Eosinophils Relative: 0 % (ref 0.0–5.0)
HCT: 40 % (ref 36.0–46.0)
Hemoglobin: 12.8 g/dL (ref 12.0–15.0)
Lymphocytes Relative: 7.5 % — ABNORMAL LOW (ref 12.0–46.0)
Lymphs Abs: 0.9 10*3/uL (ref 0.7–4.0)
MCHC: 31.9 g/dL (ref 30.0–36.0)
MCV: 82.2 fl (ref 78.0–100.0)
Monocytes Absolute: 0.4 10*3/uL (ref 0.1–1.0)
Monocytes Relative: 3.7 % (ref 3.0–12.0)
Neutro Abs: 10.1 10*3/uL — ABNORMAL HIGH (ref 1.4–7.7)
Neutrophils Relative %: 88.7 % — ABNORMAL HIGH (ref 43.0–77.0)
Platelets: 228 10*3/uL (ref 150.0–400.0)
RBC: 4.87 Mil/uL (ref 3.87–5.11)
RDW: 15.3 % (ref 11.5–15.5)
WBC: 11.4 10*3/uL — ABNORMAL HIGH (ref 4.0–10.5)

## 2022-03-15 LAB — D-DIMER, QUANTITATIVE: D-Dimer, Quant: 0.3 mcg/mL FEU (ref <0.50)

## 2022-03-15 LAB — TSH: TSH: 0.52 u[IU]/mL (ref 0.35–5.50)

## 2022-03-15 LAB — BASIC METABOLIC PANEL
BUN: 21 mg/dL (ref 6–23)
CO2: 26 mEq/L (ref 19–32)
Calcium: 9.1 mg/dL (ref 8.4–10.5)
Chloride: 108 mEq/L (ref 96–112)
Creatinine, Ser: 1.12 mg/dL (ref 0.40–1.20)
GFR: 52.64 mL/min — ABNORMAL LOW (ref >60.00)
Glucose, Bld: 126 mg/dL — ABNORMAL HIGH (ref 70–99)
Potassium: 3.9 mEq/L (ref 3.5–5.1)
Sodium: 143 mEq/L (ref 135–145)

## 2022-03-15 LAB — BRAIN NATRIURETIC PEPTIDE: Pro B Natriuretic peptide (BNP): 70 pg/mL (ref 0.0–100.0)

## 2022-03-15 NOTE — Progress Notes (Signed)
Synopsis: Referred for recurrent bronchitis by Samuel Bouche, NP  Subjective:   PATIENT ID: Alyssa [name] GENDER: female DOB: 1960/04/27, MRN: [id number]  Chief Complaint  Patient presents with   Acute Visit    Bronchitis since 02/09/22.  SOB and pain in R chest/rib area.  Feels better while on prednisone   61yF with history of DVT/PE, mild intermittent asthma, chronic bronchitis, HTN, GERD referred for recurrent bronchitis.  Last seen in ov with BW 02/21/22 after having just had bronchitis completing z pack, steroid course 1/16. Reported lots of dyspnea, wheeze and pleuritic CP, worse with movement. She was given doxy and pred taper, rx for symbicort 80 2 puffs BID, tessalon perles.  Prednisone taper refilled 2/5 and encouraged to come in for ov, still on prednisone.   She thinks she may have pleurisy. Every time steroids are tapered her pleuritic pain, pain with movement worsens on right side. Heat helps with the pain. She does have some worsening shortness of breath with exertion as well. Still coughing up whitish sputum.   She is taking symbicort 2 puffs twice daily since it was prescribed but she's not sure that it's been helpful.   Very minor weight increase since last year.   Otherwise pertinent review of systems is negative.  Past Medical History:  Diagnosis Date   Anxiety 05/2018   Arthritis    Asthma    Costochondritis    DVT (deep venous thrombosis) (HCC)    GERD (gastroesophageal reflux disease)    Hyperlipidemia, mixed 08/09/2017   Hypertension    OSA (obstructive sleep apnea) 01/20/2022   Pulmonary emboli (HCC)      Family History  Problem Relation Age of Onset   Congestive Heart Failure Mother    Stroke Mother    Heart disease Mother    Hypertension Mother    Heart disease Brother        Pacemaker     Past Surgical History:  Procedure Laterality Date   CESAREAN SECTION     CHOLECYSTECTOMY     KNEE ARTHROSCOPY W/ MENISCAL REPAIR Left    LUMBAR  [place] ARTHROPLASTY     SPINE SURGERY  2006    Social History   Socioeconomic History   Marital status: Single    Spouse name: Not on file   Number of children: Not on file   Years of education: Not on file   Highest education level: Not on file  Occupational History   Not on file  Tobacco Use   Smoking status: Never   Smokeless tobacco: Never   Tobacco comments:    Exposure to second hand smoke  Substance and Sexual Activity   Alcohol use: Never   Drug use: Never   Sexual activity: Not Currently    Birth control/protection: None  Other Topics Concern   Not on file  Social History Narrative   Not on file   Social Determinants of Health   Financial Resource Strain: Not on file  Food Insecurity: Not on file  Transportation Needs: Not on file  Physical Activity: Not on file  Stress: Not on file  Social Connections: Not on file  Intimate Partner Violence: Not on file     Allergies  Allergen Reactions   Sulfa Antibiotics Hives   Albuterol Sulfate Cough and Nausea And Vomiting   Lactose Intolerance (Gi)      Outpatient Medications Prior to Visit  Medication Sig Dispense Refill   acetaminophen (TYLENOL) 325 MG tablet Take 2 tablets (650 mg  total) by mouth every 6 (six) hours as needed for mild pain or headache.     albuterol (PROVENTIL) (2.5 MG/3ML) 0.083% nebulizer solution Use every 4-6 hours as needed for shortness of breath or cough. 360 mL 12   benzonatate (TESSALON) 200 MG capsule Take 1 capsule (200 mg total) by mouth 3 (three) times daily as needed for cough. 30 capsule 1   budesonide-formoterol (SYMBICORT) 80-4.5 MCG/ACT inhaler Inhale 2 puffs into the lungs 2 (two) times daily. 1 each 0   ELIQUIS 2.5 MG TABS tablet TAKE 1 TABLET BY MOUTH TWICE A DAY 180 tablet 3   metoprolol succinate (TOPROL-XL) 50 MG 24 hr tablet Take 1 tablet (50 mg total) by mouth daily. 90 tablet 1   predniSONE (DELTASONE) 10 MG tablet Take 4 tablets (40 mg total) by mouth daily with  breakfast for 3 days, THEN 3 tablets (30 mg total) daily with breakfast for 3 days, THEN 2 tablets (20 mg total) daily with breakfast for 3 days, THEN 1 tablet (10 mg total) daily with breakfast for 3 days. 30 tablet 0   promethazine-dextromethorphan (PROMETHAZINE-DM) 6.25-15 MG/5ML syrup Take 5 mLs by mouth every 6 (six) hours as needed for cough. 118 mL 0   valsartan (DIOVAN) 80 MG tablet Take 1 tablet (80 mg total) by mouth 2 (two) times daily. 180 tablet 3   azithromycin (ZITHROMAX Z-PAK) 250 MG tablet Take 2 tablets day 1 and then 1 daily for 4 days (Patient not taking: Reported on 02/21/2022) 6 each 0   doxycycline (VIBRA-TABS) 100 MG tablet Take 1 tablet (100 mg total) by mouth 2 (two) times daily. (Patient not taking: Reported on 03/15/2022) 14 tablet 0   fexofenadine (ALLEGRA) 180 MG tablet Take 1 tablet (180 mg total) by mouth daily. (Patient not taking: Reported on 03/15/2022) 90 tablet 3   fluticasone (FLONASE) 50 MCG/ACT nasal spray SPRAY 2 SPRAYS INTO EACH NOSTRIL EVERY DAY (Patient not taking: Reported on 03/15/2022) 48 mL 1   No facility-administered medications prior to visit.       Objective:   Physical Exam:  General appearance: 62 y.o., female, NAD, conversant  Eyes: anicteric sclerae; PERRL, tracking appropriately HENT: NCAT; MMM Neck: Trachea midline; no lymphadenopathy, no JVD Lungs: CTAB, no crackles, no wheeze, with normal respiratory effort CV: RRR, no murmur  Abdomen: Soft, non-tender; non-distended, BS present  Extremities: 2+ nonpitting edema BLE, warm Skin: Normal turgor and texture; no rash Psych: Appropriate affect Neuro: Alert and oriented to person and place, no focal deficit     Vitals:   03/15/22 1333  BP: 134/74  Pulse: 98  Temp: 98.4 F (36.9 C)  TempSrc: Oral  SpO2: 98%  Weight: 285 lb 6.4 oz (129.5 kg)  Height: 5' 6"$  (1.676 m)   98% on RA BMI Readings from Last 3 Encounters:  03/15/22 46.06 kg/m  02/21/22 45.84 kg/m  02/09/22 45.84  kg/m   Wt Readings from Last 3 Encounters:  03/15/22 285 lb 6.4 oz (129.5 kg)  02/21/22 284 lb (128.8 kg)  02/09/22 284 lb (128.8 kg)     CBC    Component Value Date/Time   WBC 8.2 05/24/2021 1455   RBC 4.94 05/24/2021 1455   HGB 12.1 05/24/2021 1455   HCT 38.0 05/24/2021 1455   PLT 259.0 05/24/2021 1455   MCV 76.8 (L) 05/24/2021 1455   MCH 24.7 (L) 02/19/2021 2034   MCHC 32.0 05/24/2021 1455   RDW 17.0 (H) 05/24/2021 1455   LYMPHSABS 1.4 05/24/2021 1455  MONOABS 0.6 05/24/2021 1455   EOSABS 0.0 05/24/2021 1455   BASOSABS 0.0 05/24/2021 1455     Chest Imaging: CXR clear lungs 02/21/22  CTA Chest 02/19/21 bronchial wall thickening, patulous esophagus   Pulmonary Functions Testing Results:    Latest Ref Rng & Units 01/15/2021   10:54 AM  PFT Results  FVC-Pre L 2.36   FVC-Predicted Pre % 82   FVC-Post L 2.27   FVC-Predicted Post % 79   Pre FEV1/FVC % % 86   Post FEV1/FCV % % 87   FEV1-Pre L 2.04   FEV1-Predicted Pre % 90   FEV1-Post L 1.97   DLCO uncorrected ml/min/mmHg 15.47   DLCO UNC% % 71   DLCO corrected ml/min/mmHg 15.77   DLCO COR %Predicted % 73   DLVA Predicted % 123   TLC L 3.86   TLC % Predicted % 72   RV % Predicted % 75    Mild restriction with low ERV, mild reduction in diffusing capacity with high KCO    Echocardiogram 01/14/21:    1. Left ventricular ejection fraction, by estimation, is 60 to 65%. The  left ventricle has normal function. The left ventricle has no regional  wall motion abnormalities. There is mild left ventricular hypertrophy.  Left ventricular diastolic parameters  are consistent with Grade II diastolic dysfunction (pseudonormalization).   2. Right ventricular systolic function is normal. The right ventricular  size is normal. There is normal pulmonary artery systolic pressure.   3. The mitral valve is normal in structure. No evidence of mitral valve  regurgitation. No evidence of mitral stenosis.   4. The aortic  valve is normal in structure. Aortic valve regurgitation is  not visualized. No aortic stenosis is present.   5. The inferior vena cava is normal in size with greater than 50%  respiratory variability, suggesting right atrial pressure of 3 mmHg.       Assessment & Plan:   # Acute dyspnea # Acute on chronic cough # R pleuritic CP, chest wall tenderness, pain with movement  Unclear etiology of acute dyspnea, acute on chronic cough that seems to transiently improve with steroids. Possibility of slow to recover asthma exacerbation, acute on chronic diastolic heart failure - appears volume overloaded on exam and though BNP WNL she does have severe obesity, less likely new ILD. She actually wonders if she has pleuritis. Her acute pleuritic, chest wall pain raises concern for rib fracture.   Plan: - continue steroid taper, symbicort, albuterol prn, prn cough suppressants already prescribed - f/u CT Chest asap - if unremarkable then trial diuretic     Maryjane Hurter, MD [facility] 03/15/2022 1:41 PM

## 2022-03-15 NOTE — Patient Instructions (Addendum)
-   labs today, CT Chest (possibly with contrast to look for lung blood clot depending on results of labs first) - will be in touch afterward to discuss plan - for now continue steroid taper, symbicort, albuterol as needed

## 2022-03-15 NOTE — Telephone Encounter (Signed)
Pt was seen today by Dr.Meier and issues were addressed during OV

## 2022-03-16 ENCOUNTER — Other Ambulatory Visit: Payer: Self-pay | Admitting: Primary Care

## 2022-03-16 ENCOUNTER — Other Ambulatory Visit: Payer: Self-pay | Admitting: Nurse Practitioner

## 2022-03-18 NOTE — Telephone Encounter (Signed)
Please advise on refill request

## 2022-03-18 NOTE — Telephone Encounter (Signed)
yes

## 2022-03-24 MED ORDER — BUDESONIDE-FORMOTEROL FUMARATE 80-4.5 MCG/ACT IN AERO: 2.0000 | INHALATION_SPRAY | Freq: Two times a day (BID) | RESPIRATORY_TRACT | 5 refills | Status: DC

## 2022-03-24 NOTE — Addendum Note (Signed)
Addended by: Collier Salina on: 03/24/2022 01:10 PM   Modules accepted: Orders

## 2022-03-30 ENCOUNTER — Other Ambulatory Visit: Payer: Self-pay | Admitting: Adult Health

## 2022-03-30 DIAGNOSIS — R058 Other specified cough: Secondary | ICD-10-CM

## 2022-03-30 DIAGNOSIS — J302 Other seasonal allergic rhinitis: Secondary | ICD-10-CM

## 2022-03-31 ENCOUNTER — Inpatient Hospital Stay: Admission: RE | Admit: 2022-03-31 | Payer: BLUE CROSS/BLUE SHIELD | Source: Ambulatory Visit

## 2022-04-08 ENCOUNTER — Ambulatory Visit: Payer: BLUE CROSS/BLUE SHIELD | Admitting: Emergency Medicine

## 2022-05-01 ENCOUNTER — Other Ambulatory Visit: Payer: Self-pay | Admitting: Cardiology

## 2022-05-02 NOTE — Telephone Encounter (Signed)
Rx refill sent to pharmacy. 

## 2022-05-05 ENCOUNTER — Ambulatory Visit: Payer: BLUE CROSS/BLUE SHIELD | Admitting: Emergency Medicine

## 2022-05-13 ENCOUNTER — Other Ambulatory Visit: Payer: Self-pay | Admitting: Primary Care

## 2022-05-20 ENCOUNTER — Encounter: Payer: Self-pay | Admitting: Emergency Medicine

## 2022-05-20 ENCOUNTER — Ambulatory Visit (INDEPENDENT_AMBULATORY_CARE_PROVIDER_SITE_OTHER): Payer: BLUE CROSS/BLUE SHIELD | Admitting: Emergency Medicine

## 2022-05-20 VITALS — BP 128/76 | HR 87 | Temp 98.2°F | Ht 66.0 in | Wt 297.0 lb

## 2022-05-20 DIAGNOSIS — G4733 Obstructive sleep apnea (adult) (pediatric): Secondary | ICD-10-CM | POA: Diagnosis not present

## 2022-05-20 DIAGNOSIS — J4520 Mild intermittent asthma, uncomplicated: Secondary | ICD-10-CM | POA: Diagnosis not present

## 2022-05-20 DIAGNOSIS — I2699 Other pulmonary embolism without acute cor pulmonale: Secondary | ICD-10-CM

## 2022-05-20 DIAGNOSIS — R053 Chronic cough: Secondary | ICD-10-CM | POA: Diagnosis not present

## 2022-05-20 DIAGNOSIS — Z7901 Long term (current) use of anticoagulants: Secondary | ICD-10-CM

## 2022-05-20 NOTE — Assessment & Plan Note (Signed)
We reviewed your sleep study today.  It shows mild obstructive sleep apnea without any evidence of low oxygen levels.  We will hold off on referral for CPAP at this time

## 2022-05-20 NOTE — Progress Notes (Signed)
Subjective:    Patient ID: Alyssa [name], female    DOB: 14-Sep-1960, 62 y.o.   MRN: [id number]  HPI  ROV 01/20/22 --62 year old woman.  With history of VTE, DVT and PE in 10/2020, question provoked by immobility due to arthritis.  She was treated with Xarelto, then transition to prophylactic dose Eliquis in March.  She was having associated headaches, followed appear with TP to discuss - may have been sinuses.  Also some question of allergic asthma.  Pulmonary function testing is reassuring.  She does have albuterol available to use if needed.  She is interested in getting the RSV and flu shots today.  She underwent a sleep study 01/04/2022 that showed mild obstructive sleep apnea AHI 7.6/h without any central apneas.  Minimal desaturation (89%). She is on flonase on schedule and allegra.   Today she reports that her sinus sx are improved. She is tolerating the Eliquis.  Sometimes sleepy during the day. Will nap on Sundays. Sleep is not restorative.  ROV 05/20/22 --follow-up visit 62 year old woman with a history of DVT/PE (10/2020) question provoked due to immobility due to her arthritis.  She is on prophylactic dose Eliquis.  Mild intermittent asthma, chronic bronchitic symptoms, hypertension, GERD.  Last seen in our office 03/2022 with acute dyspnea, persistent cough that improved with steroids.   She reports today that she is improved. She does intermittently deal w recurrent cough, usually dry, can be associated with some R CP/back pain. She is on symbicort - now feels that she is benefiting. Uses albuterol about 2x a month. Hoarse voice. Not experiencing any congestion or drainage right now - off of flonase and allegra. She had a sleep study but is not willing to try CPAP.    Review of Systems As per HPI     Objective:   Physical Exam Vitals:   05/20/22 1537  BP: 128/76  Pulse: 87  Temp: 98.2 F (36.8 C)  TempSrc: Oral  SpO2: 96%  Weight: 297 lb (134.7 kg)  Height:  (1.676  m)   Gen: Pleasant, obese woman, in no distress,  normal affect  ENT: No lesions,  mouth clear,  oropharynx clear, no postnasal drip, hoarse voice  Neck: No JVD, no stridor  Lungs: No use of accessory muscles, decreased at both bases, no crackles or wheezing on normal respiration, no wheeze on forced expiration  Cardiovascular: RRR, heart sounds normal, no murmur or gallops, no peripheral edema  Musculoskeletal: No deformities, no cyanosis or clubbing  Neuro: alert, awake, non focal  Skin: Warm, no lesions or rash      Assessment & Plan:   Pulmonary embolism on long-term anticoagulation therapy (HCC) On prophylactic dose Eliquis.  Plan to continue same.  She does not want to come off of this  Mild intermittent asthma Please continue Symbicort 2 puffs twice a day.  Rinse and gargle after using. Keep albuterol available to use 2 puffs if needed for shortness of breath, chest tightness, wheezing. Follow with APP in 6 months Follow Dr. Delton Coombes in 12 months or sooner if you have problems.  Chronic cough Not currently on a GERD or allergy regimen although she does have both issues.  That said her upper airway instability is improved, cough is improved.  She does still have some hoarseness.  Would certainly restart allergy regimen, GERD regimen if her cough recurs or becomes persistent.  If your dry cough returns we will need to consider getting you back on a dedicated allergy regimen, dedicated  stomach acid medication.  Both allergies and esophageal reflux will sustain cough.  OSA (obstructive sleep apnea) We reviewed your sleep study today.  It shows mild obstructive sleep apnea without any evidence of low oxygen levels.  We will hold off on referral for CPAP at this time   Levy Pupa, MD, PhD 05/20/2022, 4:19 PM [facility] [phone] or if no answer before 7:00PM call [phone] For any issues after 7:00PM please call eLink [phone]

## 2022-05-20 NOTE — Assessment & Plan Note (Signed)
Not currently on a GERD or allergy regimen although she does have both issues.  That said her upper airway instability is improved, cough is improved.  She does still have some hoarseness.  Would certainly restart allergy regimen, GERD regimen if her cough recurs or becomes persistent.  If your dry cough returns we will need to consider getting you back on a dedicated allergy regimen, dedicated stomach acid medication.  Both allergies and esophageal reflux will sustain cough.

## 2022-05-20 NOTE — Telephone Encounter (Signed)
Patient requested appointment with Dr Bing Matter, offered appointment for Tuesday 05/24/2022 in [place] office. Patient states that she will can not come that day and would prefer an appointment in the [facility] office because it is closer for her.  I instructed patient that I would forward Dr Vanetta Shawl nurse for assistance. Patient voices understanding and agreed

## 2022-05-20 NOTE — Assessment & Plan Note (Signed)
On prophylactic dose Eliquis.  Plan to continue same.  She does not want to come off of this

## 2022-05-20 NOTE — Patient Instructions (Signed)
Please continue Symbicort 2 puffs twice a day.  Rinse and gargle after using. Keep albuterol available to use 2 puffs if needed for shortness of breath, chest tightness, wheezing. Continue Eliquis twice a day as you have been taking it If your dry cough returns we will need to consider getting you back on a dedicated allergy regimen, dedicated stomach acid medication.  Both allergies and esophageal reflux will sustain cough. We reviewed your sleep study today.  It shows mild obstructive sleep apnea without any evidence of low oxygen levels.  We will hold off on referral for CPAP at this time Please follow-up with cardiology regarding your increased lower extremity swelling Follow with APP in 6 months Follow Dr. Delton Coombes in 12 months or sooner if you have problems.

## 2022-05-20 NOTE — Assessment & Plan Note (Signed)
Please continue Symbicort 2 puffs twice a day.  Rinse and gargle after using. Keep albuterol available to use 2 puffs if needed for shortness of breath, chest tightness, wheezing. Follow with APP in 6 months Follow Dr. Delton Coombes in 12 months or sooner if you have problems.

## 2022-05-31 ENCOUNTER — Ambulatory Visit: Payer: BLUE CROSS/BLUE SHIELD | Admitting: Cardiology

## 2022-06-03 ENCOUNTER — Ambulatory Visit: Payer: BLUE CROSS/BLUE SHIELD | Admitting: Medical-Surgical

## 2022-06-16 ENCOUNTER — Ambulatory Visit: Payer: BLUE CROSS/BLUE SHIELD | Admitting: Cardiology

## 2022-08-01 ENCOUNTER — Ambulatory Visit: Payer: BLUE CROSS/BLUE SHIELD | Attending: Cardiology | Admitting: Cardiology

## 2022-08-01 VITALS — BP 142/84 | HR 92 | Ht 66.0 in | Wt 289.2 lb

## 2022-08-01 DIAGNOSIS — I10 Essential (primary) hypertension: Secondary | ICD-10-CM

## 2022-08-01 DIAGNOSIS — G4733 Obstructive sleep apnea (adult) (pediatric): Secondary | ICD-10-CM

## 2022-08-01 DIAGNOSIS — I2699 Other pulmonary embolism without acute cor pulmonale: Secondary | ICD-10-CM

## 2022-08-01 DIAGNOSIS — Z86718 Personal history of other venous thrombosis and embolism: Secondary | ICD-10-CM | POA: Diagnosis not present

## 2022-08-01 DIAGNOSIS — R0609 Other forms of dyspnea: Secondary | ICD-10-CM

## 2022-08-01 DIAGNOSIS — Z7901 Long term (current) use of anticoagulants: Secondary | ICD-10-CM

## 2022-08-01 MED ORDER — FUROSEMIDE 20 MG PO TABS: 20.0000 mg | ORAL_TABLET | Freq: Every day | ORAL | 3 refills | Status: DC

## 2022-08-01 NOTE — Progress Notes (Signed)
Cardiology Office Note:    Date:  08/01/2022   ID:  Alyssa [name], DOB 1960/10/11, MRN [id number]  PCP:  Christen Butter, NP  Cardiologist:  Gypsy Balsam, MD    Referring MD: Christen Butter, NP   Chief Complaint  Patient presents with   Foot Swelling    History of Present Illness:    Alyssa [name] is a 62 y.o. female past medical history significant for essential hypertension, history of DVT PE, anticoagulation long-term, dyslipidemia, borderline diabetes.  She comes today because she noticed symmetrical swelling of both lower extremities.  Better some days when she required to be seen.  There is no pain in the calf there is no chest pain no shortness of breath otherwise seems to be doing well  Past Medical History:  Diagnosis Date   Anxiety 05/2018   Arthritis    Asthma    Costochondritis    DVT (deep venous thrombosis) (HCC)    GERD (gastroesophageal reflux disease)    Hyperlipidemia, mixed 08/09/2017   Hypertension    OSA (obstructive sleep apnea) 01/20/2022   Pulmonary emboli (HCC)     Past Surgical History:  Procedure Laterality Date   CESAREAN SECTION     CHOLECYSTECTOMY     KNEE ARTHROSCOPY W/ MENISCAL REPAIR Left    LUMBAR DISC ARTHROPLASTY     SPINE SURGERY  2006    Current Medications: Current Meds  Medication Sig   acetaminophen (TYLENOL) 325 MG tablet Take 2 tablets (650 mg total) by mouth every 6 (six) hours as needed for mild pain or headache.   albuterol (PROVENTIL) (2.5 MG/3ML) 0.083% nebulizer solution Use every 4-6 hours as needed for shortness of breath or cough.   azithromycin (ZITHROMAX Z-PAK) 250 MG tablet Take 2 tablets day 1 and then 1 daily for 4 days   benzonatate (TESSALON) 200 MG capsule TAKE 1 CAPSULE (200 MG TOTAL) BY MOUTH 3 (THREE) TIMES DAILY AS NEEDED FOR COUGH.   budesonide-formoterol (SYMBICORT) 80-4.5 MCG/ACT inhaler Inhale 2 puffs into the lungs 2 (two) times daily.   doxycycline (VIBRA-TABS) 100 MG tablet Take 1 tablet (100  mg total) by mouth 2 (two) times daily.   ELIQUIS 2.5 MG TABS tablet TAKE 1 TABLET BY MOUTH TWICE A DAY   fexofenadine (ALLEGRA) 180 MG tablet Take 1 tablet (180 mg total) by mouth daily.   fluticasone (FLONASE) 50 MCG/ACT nasal spray SPRAY 2 SPRAYS INTO EACH NOSTRIL EVERY DAY   furosemide (LASIX) 20 MG tablet Take 1 tablet (20 mg total) by mouth daily.   metoprolol succinate (TOPROL-XL) 50 MG 24 hr tablet Take 1 tablet (50 mg total) by mouth daily.   promethazine-dextromethorphan (PROMETHAZINE-DM) 6.25-15 MG/5ML syrup Take 5 mLs by mouth every 6 (six) hours as needed for cough.   valsartan (DIOVAN) 80 MG tablet TAKE 1 TABLET BY MOUTH 2 TIMES DAILY.     Allergies:   Sulfa antibiotics and Lactose intolerance (gi)   Social History   Socioeconomic History   Marital status: Single    Spouse name: Not on file   Number of children: Not on file   Years of education: Not on file   Highest education level: Not on file  Occupational History   Not on file  Tobacco Use   Smoking status: Never   Smokeless tobacco: Never   Tobacco comments:    Exposure to second hand smoke  Substance and Sexual Activity   Alcohol use: Never   Drug use: Never   Sexual activity: Not Currently  Birth control/protection: None  Other Topics Concern   Not on file  Social History Narrative   Not on file   Social Determinants of Health   Financial Resource Strain: Not on file  Food Insecurity: Not on file  Transportation Needs: Not on file  Physical Activity: Not on file  Stress: Not on file  Social Connections: Not on file     Family History: The patient's family history includes Congestive Heart Failure in her mother; Heart disease in her brother and mother; Hypertension in her mother; Stroke in her mother. ROS:   Please see the history of present illness.    All 14 point review of systems negative except as described per history of present illness  EKGs/Labs/Other Studies Reviewed:          Recent Labs: 03/15/2022: BUN 21; Creatinine, Ser 1.12; Hemoglobin 12.8; Platelets 228.0; Potassium 3.9; Pro B Natriuretic peptide (BNP) 70.0; Sodium 143; TSH 0.52  Recent Lipid Panel    Component Value Date/Time   CHOL 144 01/26/2021 0943   TRIG 115 01/26/2021 0943   HDL 39 (L) 01/26/2021 0943   CHOLHDL 3.7 01/26/2021 0943   LDLCALC 84 01/26/2021 0943    Physical Exam:    VS:  BP (!) 142/84 (BP Location: Left Arm, Patient Position: Sitting)   Pulse 92   Ht 5\' 6"  (1.676 m)   Wt 289 lb 3.2 oz (131.2 kg)   SpO2 96%   BMI 46.68 kg/m     Wt Readings from Last 3 Encounters:  08/01/22 289 lb 3.2 oz (131.2 kg)  05/20/22 297 lb (134.7 kg)  03/15/22 285 lb 6.4 oz (129.5 kg)     GEN:  Well nourished, well developed in no acute distress HEENT: Normal NECK: No JVD; No carotid bruits LYMPHATICS: No lymphadenopathy CARDIAC: RRR, no murmurs, no rubs, no gallops RESPIRATORY:  Clear to auscultation without rales, wheezing or rhonchi  ABDOMEN: Soft, non-tender, non-distended MUSCULOSKELETAL:  No edema; No deformity  SKIN: Warm and dry LOWER EXTREMITIES: no swelling NEUROLOGIC:  Alert and oriented x 3 PSYCHIATRIC:  Normal affect   ASSESSMENT:    1. History of DVT (deep vein thrombosis)   2. Primary hypertension   3. Dyspnea on exertion   4. Pulmonary embolism on long-term anticoagulation therapy (HCC)   5. OSA (obstructive sleep apnea)   6. Morbid obesity (HCC)    PLAN:    In order of problems listed above:  Swelling of lower extremities.  Asked her to start taking 20 mg Lasix every single day.  Will check Chem-7 and proBNP.  I doubt very much for PE/DVT.  Will check D-dimer just for completion, she is anticoagulated she states she took her medications religiously. Dyslipidemia I did review K PN which show me her LDL of 84 HDL 39 this is from January 26, 2021.  Will continue monitoring. Obstructive sleep apnea followed by antimedicine team.  [place] essential hypertension  blood pressure reasonably controlled   Medication Adjustments/Labs and Tests Ordered: Current medicines are reviewed at length with the patient today.  Concerns regarding medicines are outlined above.  Orders Placed This Encounter  Procedures   Basic metabolic panel   Pro b natriuretic peptide (BNP)   EKG 12-Lead   ECHOCARDIOGRAM COMPLETE   Medication changes:  Meds ordered this encounter  Medications   furosemide (LASIX) 20 MG tablet    Sig: Take 1 tablet (20 mg total) by mouth daily.    Dispense:  90 tablet    Refill:  3    Signed, Georgeanna Lea, MD, [facility] 08/01/2022 4:39 PM    [facility]

## 2022-08-01 NOTE — Patient Instructions (Signed)
Medication Instructions:   START: Lasix 20mg  1 tablet daily   Lab Work: Your physician recommends that you return for lab work in: next week You need to have labs done when you are fasting.  You can come Monday through Friday 8:30 am to 12:00 pm and 1:15 to 4:30. You do not need to make an appointment as the order has already been placed. The labs you are going to have done are BMET, Pro BNP.    Testing/Procedures: Your physician has requested that you have an echocardiogram. Echocardiography is a painless test that uses sound waves to create images of your heart. It provides your doctor with information about the size and shape of your heart and how well your heart's chambers and valves are working. This procedure takes approximately one hour. There are no restrictions for this procedure. Please do NOT wear cologne, perfume, aftershave, or lotions (deodorant is allowed). Please arrive 15 minutes prior to your appointment time.    Follow-Up: At [facility], you and your health needs are our priority.  As part of our continuing mission to provide you with exceptional heart care, we have created designated Provider Care Teams.  These Care Teams include your primary Cardiologist (physician) and Advanced Practice Providers (APPs -  Physician Assistants and Nurse Practitioners) who all work together to provide you with the care you need, when you need it.  We recommend signing up for the patient portal called "MyChart".  Sign up information is provided on this After Visit Summary.  MyChart is used to connect with patients for Virtual Visits (Telemedicine).  Patients are able to view lab/test results, encounter notes, upcoming appointments, etc.  Non-urgent messages can be sent to your provider as well.   To learn more about what you can do with MyChart, go to [email or web address].    Your next appointment:   5 month(s)  The format for your next appointment:   In Person  Provider:    Gypsy Balsam, MD    Other Instructions NA

## 2022-10-04 ENCOUNTER — Telehealth: Payer: Self-pay | Admitting: *Deleted

## 2022-10-04 ENCOUNTER — Encounter: Payer: Self-pay | Admitting: Emergency Medicine

## 2022-10-04 NOTE — Telephone Encounter (Signed)
Converted from [place] message:  Alyssa [name],  My bronchitis is back. It's been about a week. Taking my symbicort , tessalon pearls, Flonase. But I was unable to institute a refill for the steroid. I am pretty much having the exact same symptoms as before, non productive cough, chest pain on the right bronchi side, back pain in the exact same spot As the chest pain.  I am concerned that it took about 6 weeks, multiple meds until we got upon the combination above, and 4 rounds of antibiotics to knock it down last time.  I would prefer if we go at it aggressively this time so I don't have to be as sick  for as long.  I have started the meds this weekend, limiting my time in public and masking.    Thanks and the pharmacy for the steroid is the same one I always use-[facility] [address].  Thanks much. [name]

## 2022-10-05 MED ORDER — PREDNISONE 10 MG PO TABS: ORAL_TABLET | ORAL | 0 refills | Status: DC

## 2022-10-05 NOTE — Telephone Encounter (Signed)
Message forwarded to patient via mychart.

## 2022-10-05 NOTE — Telephone Encounter (Signed)
This is a Dr. Delton Coombes patient, I have only seen her once, last time being in January 2024. She has seen Dr. Thora Lance and Dr. Delton Coombes since then. Prednisone is not a refillable medication, we prescribed as needed. I am ok sending in prednisone taper for acute symptoms. If not better needs visit in office or needs to present to urgent care.

## 2022-10-06 NOTE — Telephone Encounter (Signed)
LVM prednisone taper sent to [facility] [address].If sx do not resolve patient will need office visit or go to urgent care.

## 2022-10-16 ENCOUNTER — Other Ambulatory Visit: Payer: Self-pay | Admitting: Cardiology

## 2022-10-16 ENCOUNTER — Other Ambulatory Visit: Payer: Self-pay | Admitting: Primary Care

## 2022-10-16 ENCOUNTER — Encounter: Payer: Self-pay | Admitting: Medical-Surgical

## 2022-10-18 ENCOUNTER — Encounter: Payer: Self-pay | Admitting: Medical-Surgical

## 2022-10-18 ENCOUNTER — Telehealth (INDEPENDENT_AMBULATORY_CARE_PROVIDER_SITE_OTHER): Payer: BLUE CROSS/BLUE SHIELD | Admitting: Medical-Surgical

## 2022-10-18 DIAGNOSIS — Z0289 Encounter for other administrative examinations: Secondary | ICD-10-CM | POA: Diagnosis not present

## 2022-10-18 DIAGNOSIS — M170 Bilateral primary osteoarthritis of knee: Secondary | ICD-10-CM | POA: Diagnosis not present

## 2022-10-18 DIAGNOSIS — J410 Simple chronic bronchitis: Secondary | ICD-10-CM

## 2022-10-18 MED ORDER — TRELEGY ELLIPTA 100-62.5-25 MCG/ACT IN AEPB: 1.0000 | INHALATION_SPRAY | Freq: Every day | RESPIRATORY_TRACT | 11 refills | Status: DC

## 2022-10-18 NOTE — Addendum Note (Signed)
Addended byChristen Butter on: 10/18/2022 01:14 PM   Modules accepted: Level of Service

## 2022-10-18 NOTE — Progress Notes (Signed)
Virtual Visit via Video Note  I connected with Alyssa [name] on 10/18/22 at 10:30 AM EDT by a video enabled telemedicine application and verified that I am speaking with the correct person using two identifiers.   I discussed the limitations of evaluation and management by telemedicine and the availability of in person appointments. The patient expressed understanding and agreed to proceed.  Patient location: home Provider locations: office  Subjective:    CC: Form completion  HPI: Pleasant 62 year old female presenting today via MyChart video visit with request of an accommodation for work.  She has sent a form via MyChart and would like to go over the specifics today.  She works in a sedentary position and her current chair is posing a safety risk.  When she sits down, the chair flipped backwards which puts her at risk for falls and injury.  When she tries to stand up, the chair rolls and she is unable to use it to help her get to a standing position.  She has chronic severe bilateral knee pain due to osteoarthritis and she is not a surgical candidate.  She has tried a different chair in their office which worked well for her and she was able to sit/stand without difficulty.  Her work has decided she will need an accommodation form to be able to get this particular chair.  Patient is currently followed by pulmonology as well as cardiology.  Notes that she has been having difficulty with chronic bronchitis according to what her pulmonologist told her.  She has an albuterol inhaler that she only uses for severe shortness of breath.  She was prescribed Symbicort twice a day however admits that she does not use this medication.  She has a medication on hand and carries it with her but she does not like using it and does not like the taste of the medication.  She has tried a spacer in the past but did not find this helpful.  Most recently, she reached out to her pulmonologist office and they  provided her with a burst of steroids which she completed.  She is now only taking Tessalon Perles 1 capsule daily and reports that she continues to have chest tightness, shortness of breath, and a productive cough.  Past medical history, Surgical history, Family history not pertinant except as noted below, Social history, Allergies, and medications have been entered into the medical record, reviewed, and corrections made.   Review of Systems: See HPI for pertinent positives and negatives.   Objective:    General: Speaking clearly in complete sentences without any shortness of breath.  Alert and oriented x3.  Normal judgment. No apparent acute distress.  Impression and Recommendations:    1. Encounter for completion of form with patient Discussed work accommodations and feel that the request for a different office chair is appropriate.  The form has been completed and sent to the patient via MyChart message for her to complete her part.  2. Simple chronic bronchitis (HCC) Discussed the importance of using a daily controller medication in the setting of chronic bronchitis.  She remains hesitant to consider using Symbicort even for a 2-week trial.  Discussed gold standard treatment for bronchitis and respiratory issues using triple therapy.  She is open to this so sending in Trelegy 1 puff daily.  Okay to use albuterol inhaler every 4-6 hours as needed.  Would like her to give this 2 to 4 weeks of continuous therapy then plan to follow-up here in our office.  She is also welcome to reach out to pulmonology for further input if she desires.  3. Bilateral primary osteoarthritis of knee Has been in significant severe pain with bilateral knee osteoarthritis.  Was previously working with orthopedics and orthopedic surgery however injections were unhelpful and she is not a surgical candidate.  Recommend scheduling an appointment with Dr. Karie Schwalbe in our office to discuss further options such as genicular artery  embolization.  Patient reports that she will call and get that scheduled at her earliest convenience.  I discussed the assessment and treatment plan with the patient. The patient was provided an opportunity to ask questions and all were answered. The patient agreed with the plan and demonstrated an understanding of the instructions.   The patient was advised to call back or seek an in-person evaluation if the symptoms worsen or if the condition fails to improve as anticipated.  Return for General follow-up in 2-4 weeks.  Thayer Ohm, DNP, APRN, FNP-BC [facility]

## 2022-10-21 ENCOUNTER — Telehealth: Payer: BLUE CROSS/BLUE SHIELD | Admitting: Medical-Surgical

## 2022-11-06 IMAGING — CT CT ANGIO CHEST
2 of 9 series · 18 of 36 positions shown · IV contrast (omnipaque)
Comparison: None.

CLINICAL DATA: Shortness of breath

EXAM:
CT ANGIOGRAPHY CHEST WITH CONTRAST
TECHNIQUE: Multidetector CT imaging of the chest was performed using the
standard protocol during bolus administration of intravenous
contrast. Multiplanar CT image reconstructions and MIPs were
obtained to evaluate the vascular anatomy.
CONTRAST:  100mL OMNIPAQUE IOHEXOL 350 MG/ML SOLN

[Series 8: pe thins · axial · 0.73mm/px · z∈[+1062,+1279]mm · 17 of 243 slices shown]
[im 13/243  lung]
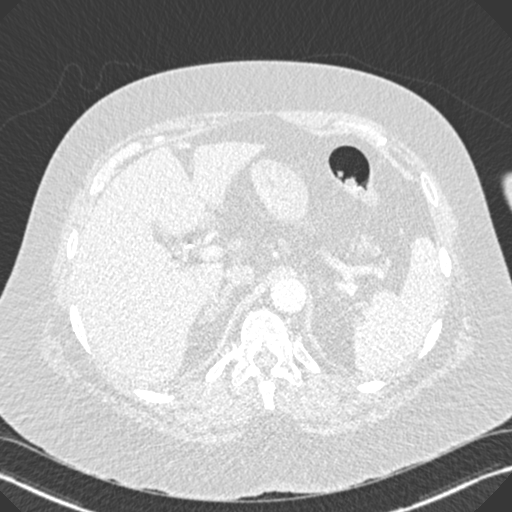
[im 26/243  mediastinal]
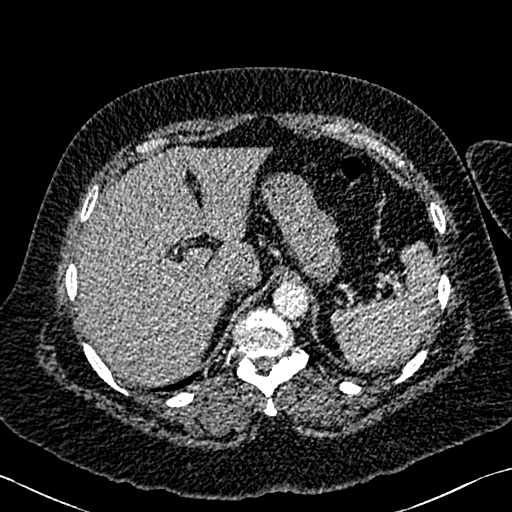
[im 39/243  lung]
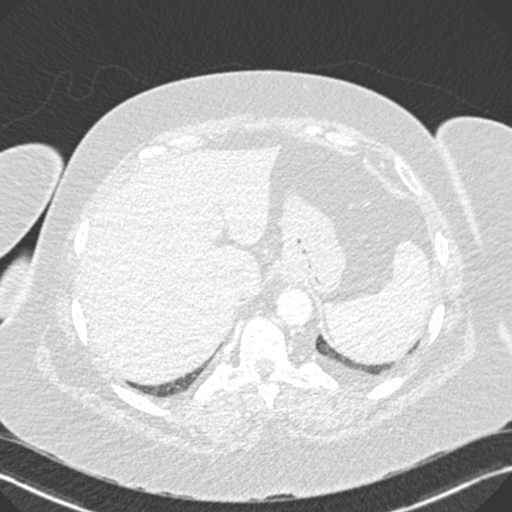
[im 51/243  mediastinal]
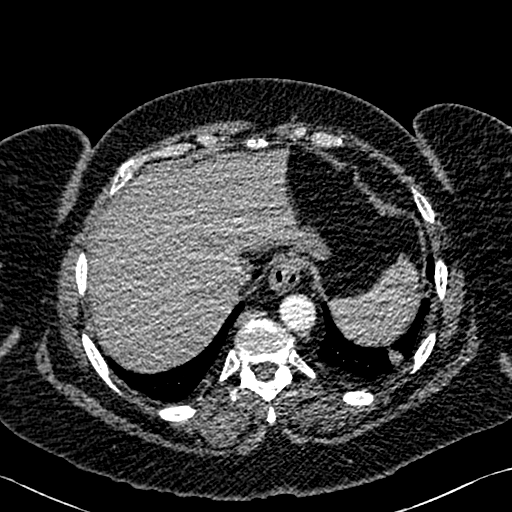
[im 64/243  lung]
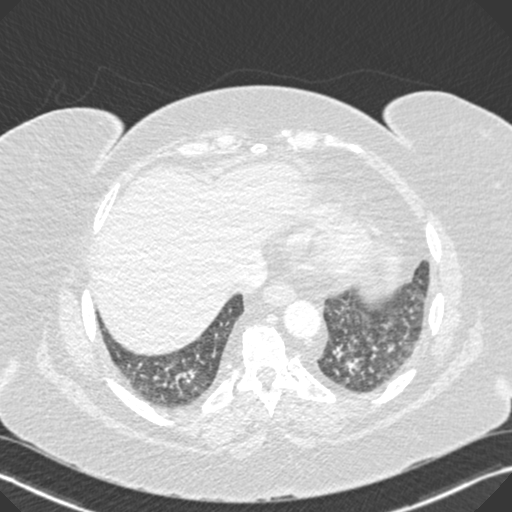
[im 77/243  mediastinal]
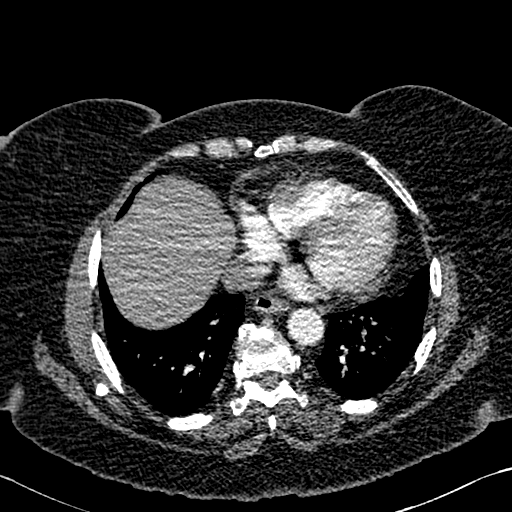
[im 90/243  lung]
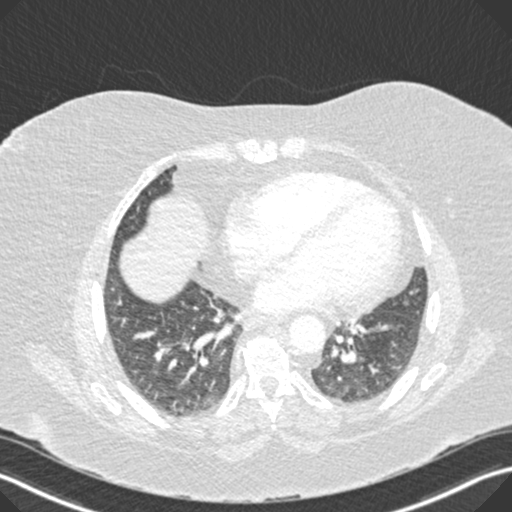
[im 102/243  mediastinal]
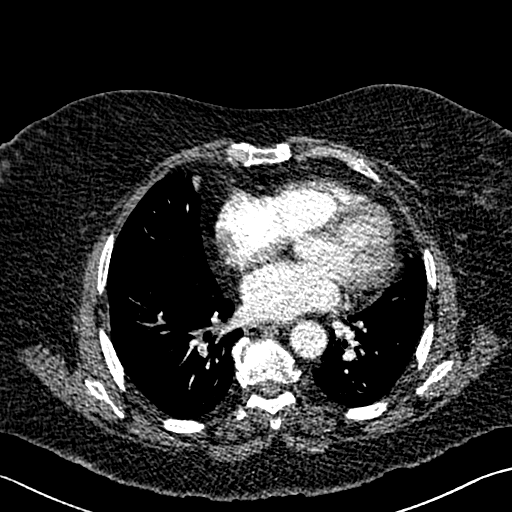
[im 128/243  lung]
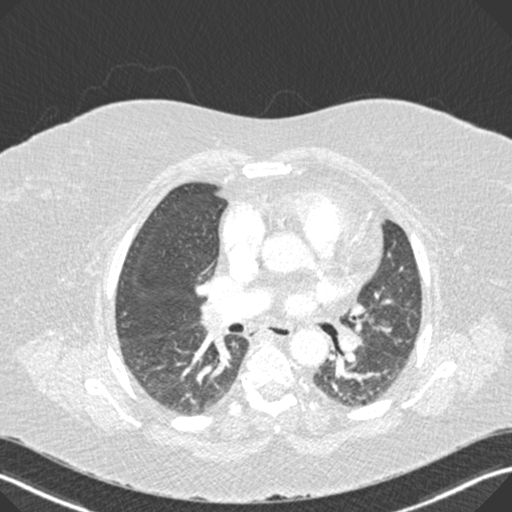
[im 141/243  mediastinal]
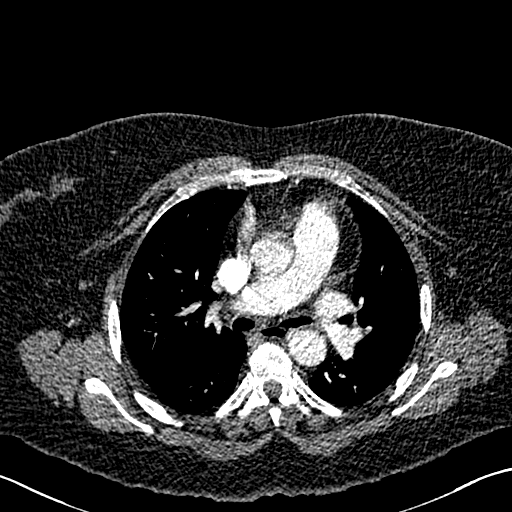
[im 153/243  lung]
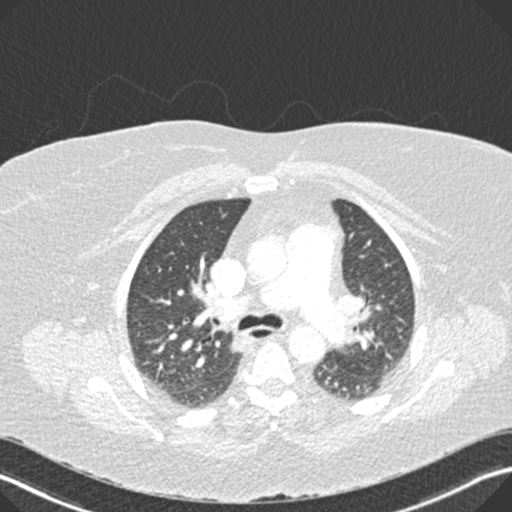
[im 166/243  mediastinal]
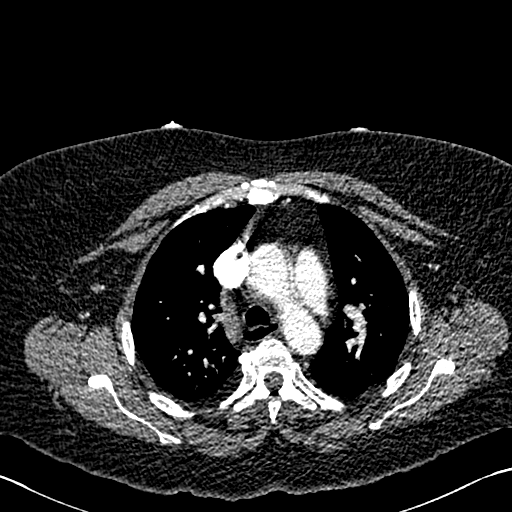
[im 179/243  lung]
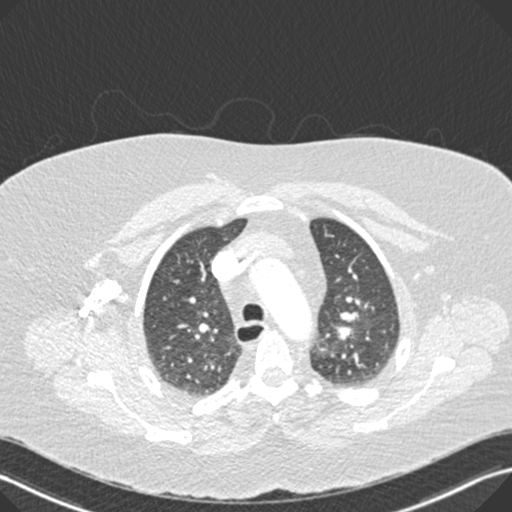
[im 192/243  mediastinal]
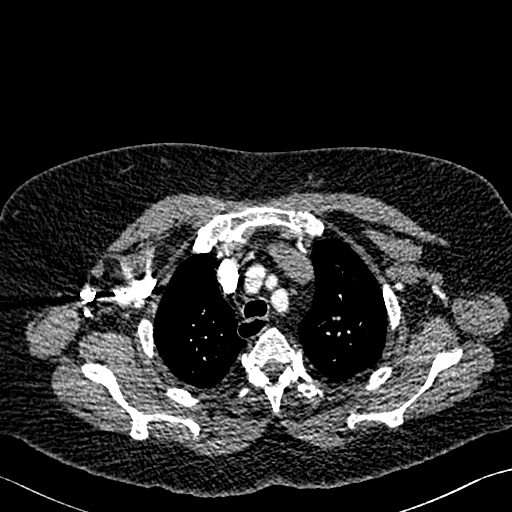
[im 204/243  lung]
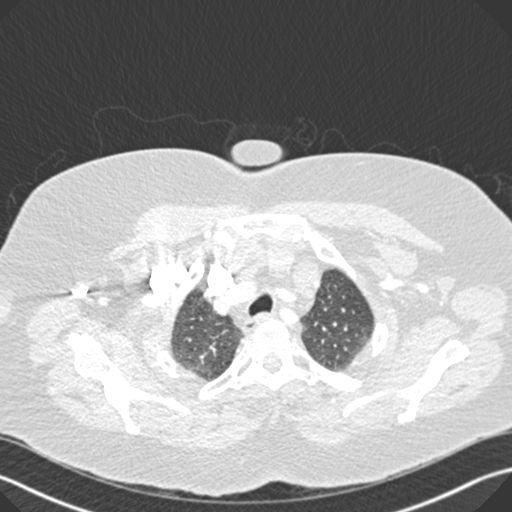
[im 217/243  mediastinal]
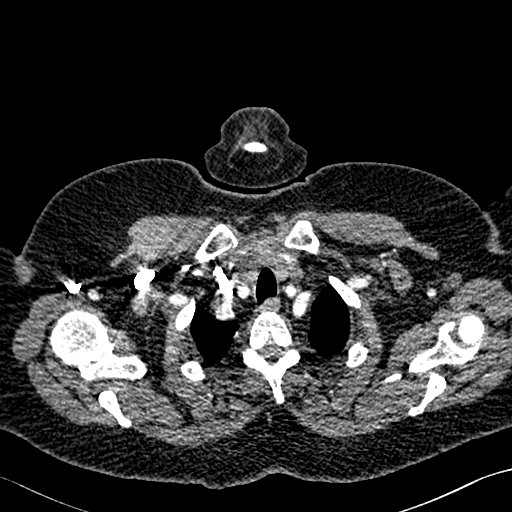
[im 230/243  lung]
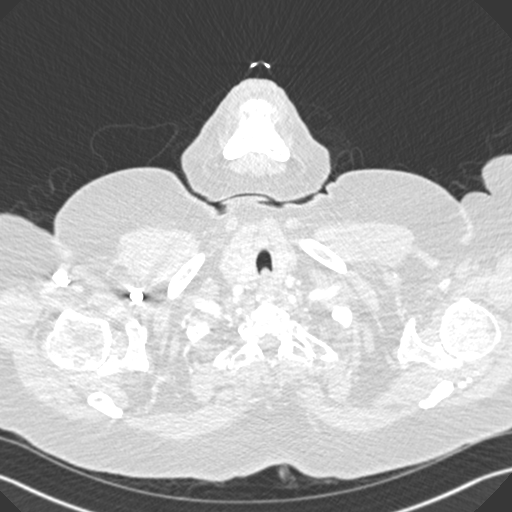

[Series 9: pe coronal mpr · coronal · 0.50mm/px · 1 of 131 slices shown]
[im 66/131  mediastinal]
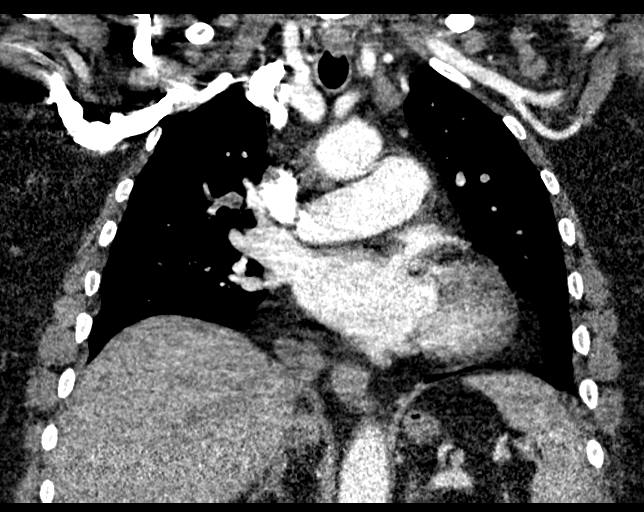

[18 of 36 positions shown; findings below may reference images not displayed]

FINDINGS: Cardiovascular: Thoracic aorta demonstrates atherosclerotic
calcifications without aneurysmal dilatation. No findings to suggest
dissection are noted. Heart is enlarged in size. Pulmonary artery is
well visualized and demonstrates multiple bilateral filling defects
consistent with bilateral pulmonary emboli. No evidence of right
heart strain is noted. No coronary calcifications are seen.

Mediastinum/Nodes: Thoracic inlet is within normal limits. No
sizable hilar or mediastinal adenopathy is noted. The esophagus as
visualized is within normal limits.

Lungs/Pleura: 12 mm nodule is noted in the left posterior
costophrenic angle. No focal infiltrate or sizable effusion is seen.

Upper Abdomen: Visualized upper abdomen shows evidence of prior
cholecystectomy. No acute abnormality is noted.

Musculoskeletal: Degenerative changes of the thoracic spine are
noted. No acute rib abnormality is seen.

Review of the MIP images confirms the above findings.
IMPRESSION: Multiple bilateral pulmonary emboli without evidence of right heart
strain.

12 mm left solid pulmonary nodule. Given the pulmonary emboli this
could represent early changes of a Hampton's hump although a
non-contrast Chest CT at 3 months is recommended.

These guidelines do not apply to immunocompromised patients and
patients with cancer. Follow up in patients with significant
comorbidities as clinically warranted. For lung cancer screening,
adhere to Lung-RADS guidelines. Reference: Radiology. 7183;
284(1):228-43.

No other focal abnormality is noted.

Aortic Atherosclerosis (XIOOY-TO6.6).

Critical Value/emergent results were called by telephone at the time
of interpretation on 11/03/2020 at [DATE] to Dr. DESMOND SPROUSE , who
verbally acknowledged these results.

## 2022-11-25 ENCOUNTER — Other Ambulatory Visit: Payer: Self-pay | Admitting: Cardiology

## 2022-11-30 IMAGING — DX DG CHEST 2V
2 series · 2 of 2 positions shown · non-contrast
Comparison: CT angiography chest 11/03/2020, chest x-ray 11/03/2020

CLINICAL DATA: Chest pain

EXAM:
CHEST - 2 VIEW

[w chest pa]
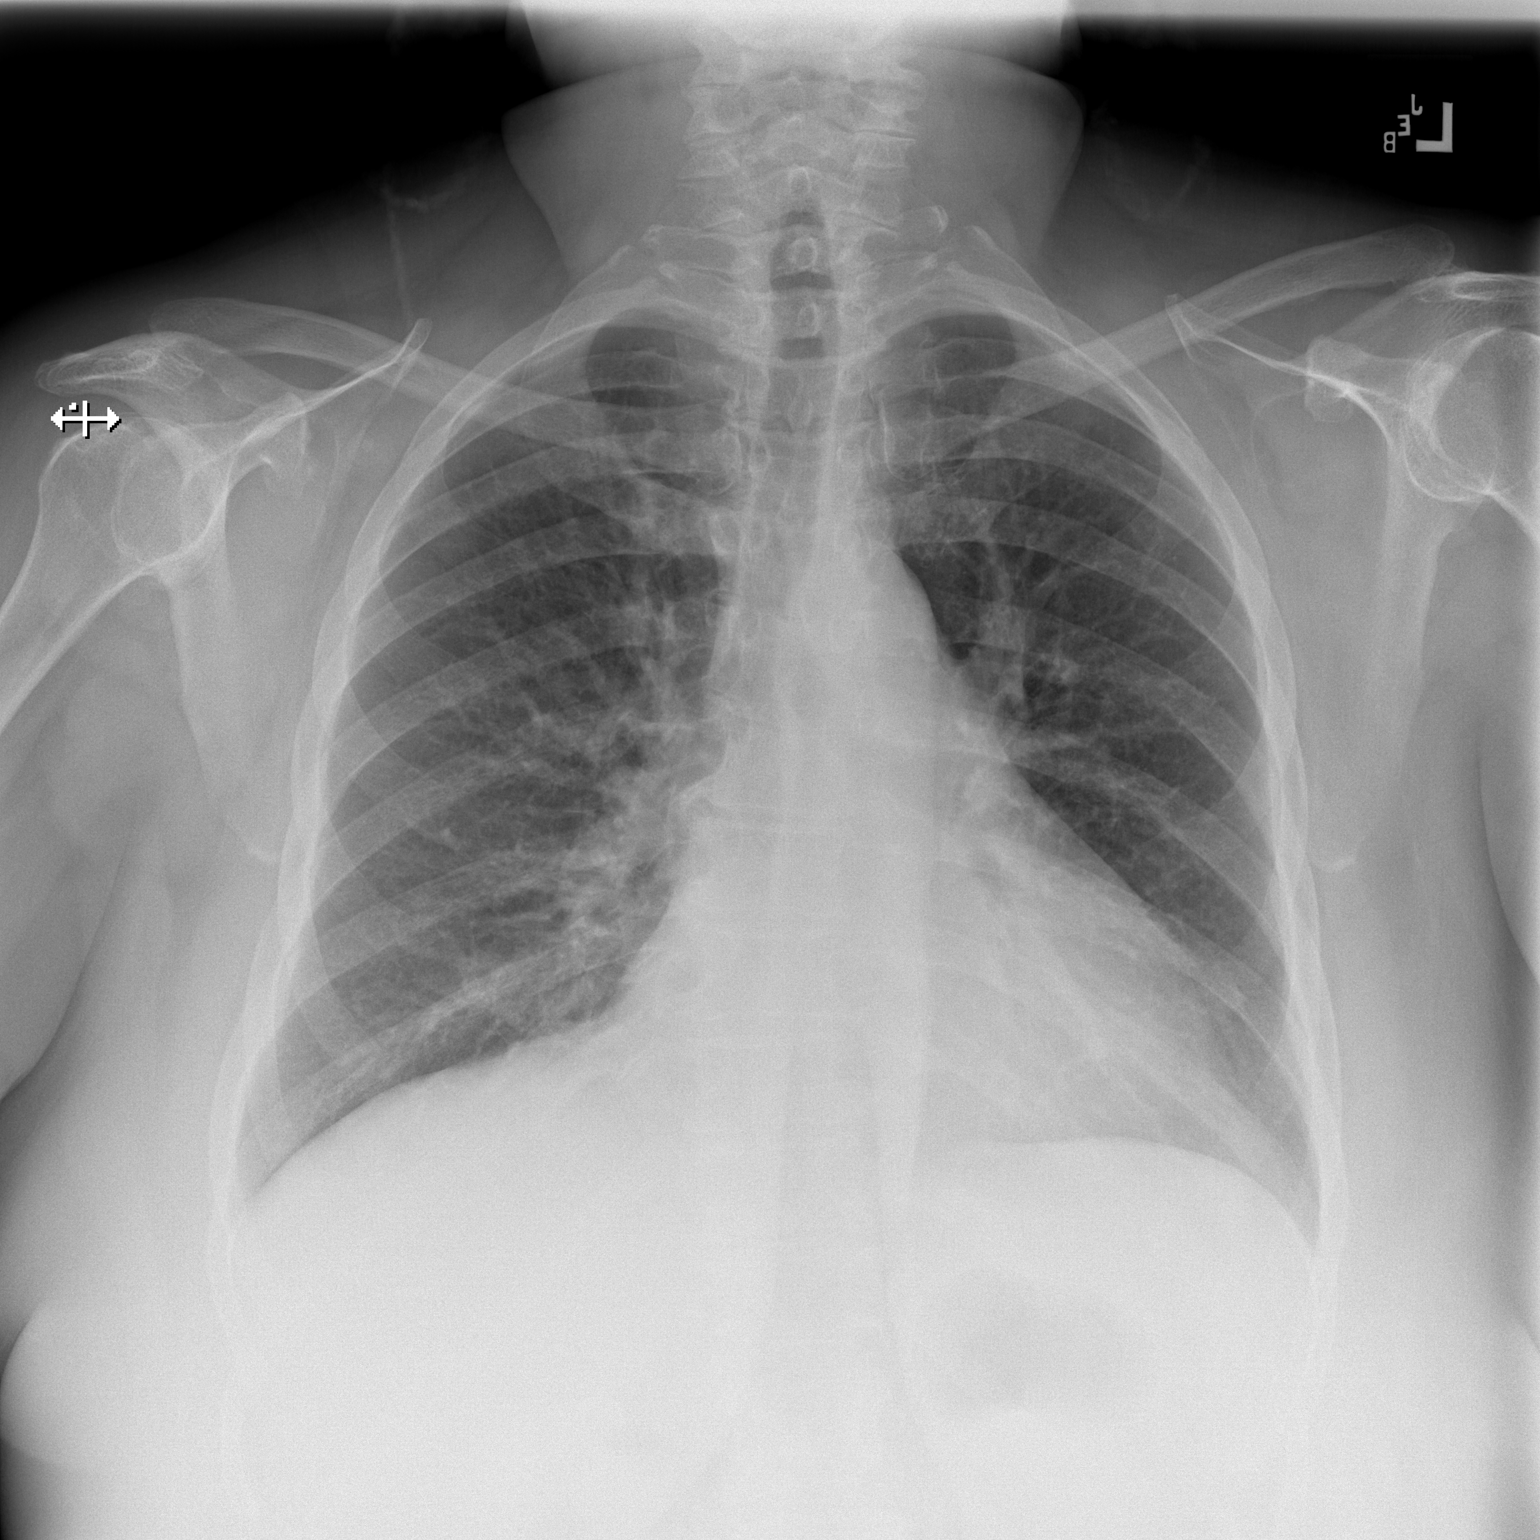

[w chest lat]
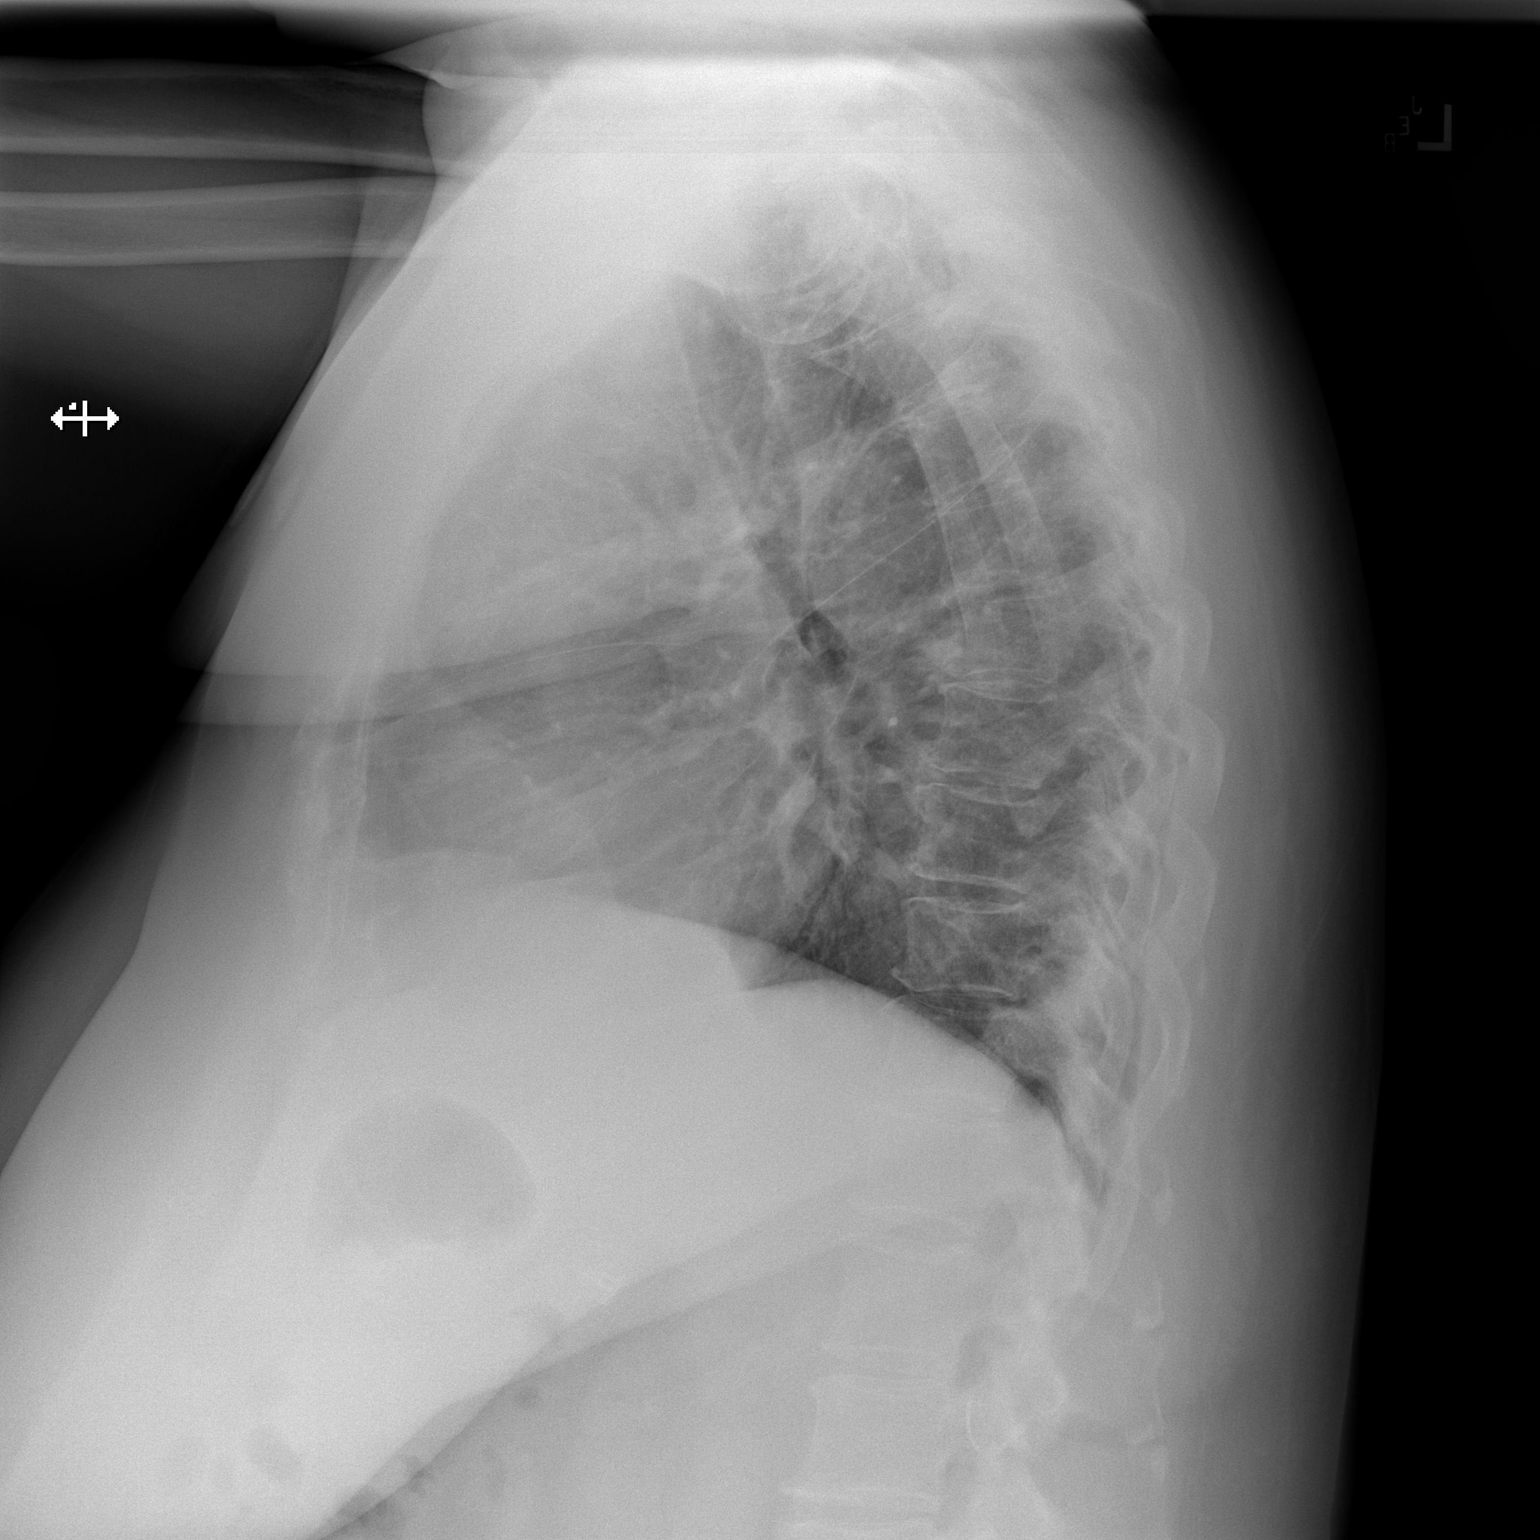

[2 of 2 positions shown; findings below may reference images not displayed]

FINDINGS: The heart and mediastinal contours are within normal limits.

No focal consolidation. No pulmonary edema. No pleural effusion. No
pneumothorax.

No acute osseous abnormality.
IMPRESSION: No active cardiopulmonary disease.

## 2022-12-14 ENCOUNTER — Encounter: Payer: BLUE CROSS/BLUE SHIELD | Admitting: Sports Medicine

## 2022-12-23 ENCOUNTER — Encounter: Payer: Self-pay | Admitting: Medical-Surgical

## 2022-12-28 ENCOUNTER — Telehealth: Payer: Self-pay

## 2022-12-28 NOTE — Telephone Encounter (Signed)
Copied from CRM [id number]. Topic: Clinical - Request for Lab/Test Order >> Dec 27, 2022  4:44 PM Fuller Mandril wrote: Reason for CRM: Pt called stated that the have confirmed she has lead in her drinking water and she needs to have test done for herself and her daughter to determine amounts of lead as soon as possible and wants to know how to proceed to have this done.

## 2023-01-03 NOTE — Telephone Encounter (Signed)
Please have her schedule an appointment so we can document any symptoms and concerns.  We can do her lab testing that day if necessary.

## 2023-01-04 NOTE — Telephone Encounter (Signed)
I called pt and left a message for her to call back and scheduled an appt for her lead concerns. Thank you

## 2023-01-11 ENCOUNTER — Telehealth: Payer: Self-pay

## 2023-01-11 ENCOUNTER — Encounter: Payer: Self-pay | Admitting: Cardiology

## 2023-01-11 ENCOUNTER — Ambulatory Visit: Payer: BLUE CROSS/BLUE SHIELD | Admitting: Emergency Medicine

## 2023-01-11 DIAGNOSIS — R0609 Other forms of dyspnea: Secondary | ICD-10-CM

## 2023-01-11 DIAGNOSIS — I10 Essential (primary) hypertension: Secondary | ICD-10-CM

## 2023-01-11 NOTE — Telephone Encounter (Signed)
Pt needing to get labs. BMP, ProBNP ordered.

## 2023-02-02 ENCOUNTER — Other Ambulatory Visit: Payer: Self-pay | Admitting: Emergency Medicine

## 2023-02-23 ENCOUNTER — Ambulatory Visit: Payer: BLUE CROSS/BLUE SHIELD | Admitting: Emergency Medicine

## 2023-02-23 ENCOUNTER — Encounter: Payer: Self-pay | Admitting: Emergency Medicine

## 2023-02-23 VITALS — BP 147/82 | HR 94 | Ht 66.0 in | Wt 278.6 lb

## 2023-02-23 DIAGNOSIS — I2699 Other pulmonary embolism without acute cor pulmonale: Secondary | ICD-10-CM | POA: Diagnosis not present

## 2023-02-23 DIAGNOSIS — J4520 Mild intermittent asthma, uncomplicated: Secondary | ICD-10-CM

## 2023-02-23 DIAGNOSIS — Z7901 Long term (current) use of anticoagulants: Secondary | ICD-10-CM

## 2023-02-23 MED ORDER — PREDNISONE 20 MG PO TABS: 20.0000 mg | ORAL_TABLET | Freq: Every day | ORAL | 0 refills | Status: DC

## 2023-02-23 NOTE — Assessment & Plan Note (Signed)
History of provoked PE.  We have left her on prevention dose Eliquis 2.5 mg twice daily.  She has dyspnea currently but this is in the setting of her URI and bronchitis.  I have asked her to come back in 3 weeks to report symptoms.  If she still having shortness of breath and I think she may need further evaluation, either repeat CT-PA or VQ scan.  If we establish that she has had recurrent PE then we would need to increase her Eliquis to 5 mg twice daily and commit her to lifelong treatment.

## 2023-02-23 NOTE — Progress Notes (Signed)
Subjective:    Patient ID: Alyssa [name], female    DOB: 24-Feb-1960, 63 y.o.   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  She is on prophylactic dose Eliquis.  Mild intermittent asthma, chronic bronchitic symptoms, hypertension, GERD.  Last seen in our office 03/2022 with acute dyspnea, persistent cough that improved with steroids.   She reports today that she is improved. She does intermittently deal w recurrent cough, usually dry, can be associated with some R CP/back pain. She is on symbicort - now feels that she is benefiting. Uses albuterol about 2x a month. Hoarse voice. Not experiencing any congestion or drainage right now - off of flonase and allegra. She had a sleep study but is not willing to try CPAP.   ROV 02/23/2023 --Alyssa [name] is a 63 with a history of DVT/PE on prophylactic dose Eliquis.  She also has mild intermittent asthma and chronic bronchitis, hypertension and GERD.  Some upper airway irritation and hoarseness as well.  She has mild OSA but has never wanted to try CPAP. Since I last saw her she was started on Trelegy by her PCP but she did not like it. Now off. Has not  She had a URI beginning January 3, was treated with doxy x 10 days. She feels better but still has some cough, some exertional SOB. She finished the abx 2 days ago.    Review of Systems As per HPI     Objective:   Physical Exam Vitals:   02/23/23 1422  BP: (!) 147/82  Pulse: 94  SpO2: 99%  Weight: 278 lb 9.6 oz (126.4 kg)  Height: 5\' 6"  (1.676 m)   Gen: Pleasant, obese woman, in no distress,  normal affect  ENT: No lesions,  mouth clear,  oropharynx clear, no postnasal drip, hoarse voice  Neck: No JVD, no stridor  Lungs: No use of accessory muscles, decreased at both bases, no crackles or wheezing on normal respiration, no wheeze on forced expiration  Cardiovascular:  RRR, heart sounds normal, no murmur or gallops, no peripheral edema  Musculoskeletal: No deformities, no cyanosis or clubbing  Neuro: alert, awake, non focal  Skin: Warm, no lesions or rash      Assessment & Plan:   Mild intermittent asthma Mild intermittent asthma versus upper airway irritation syndrome.  She has not benefited significantly from ICS/LABA and her PFT are largely reassuring.  She has a recent URI with a bronchitis that was treated with doxycycline.  She still has shortness of breath and some upper airway noise.  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  We have left her on prevention dose Eliquis 2.5 mg twice daily.  She has dyspnea currently but this is in the setting of her URI and bronchitis.  I have asked her to come back in 3 weeks to report symptoms.  If she still having shortness of breath and I think she may need further evaluation, either repeat CT-PA or VQ scan.  If we establish that she has had recurrent PE then we would need to increase her Eliquis to 5 mg twice daily and commit her to lifelong treatment.    Levy Pupa, MD, PhD 02/23/2023, 2:47 PM [facility] [phone] or if  no answer before 7:00PM call [phone] For any issues after 7:00PM please call eLink [phone]

## 2023-02-23 NOTE — Assessment & Plan Note (Signed)
Mild intermittent asthma versus upper airway irritation syndrome.  She has not benefited significantly from ICS/LABA and her PFT are largely reassuring.  She has a recent URI with a bronchitis that was treated with doxycycline.  She still has shortness of breath and some upper airway noise.  I will treat her with prednisone for 5 days to see if this can allow her to clear it

## 2023-02-23 NOTE — Patient Instructions (Addendum)
Please take prednisone 20 mg once daily for 5 days. Keep your albuterol available to use 2 puffs when needed for shortness of breath, chest tightness, wheezing. We will hold off on starting any scheduled inhaler medication at this time. Continue your Eliquis at 2.5 mg twice daily Follow-up with APP in 3 to 4 weeks.  If you are still having shortness of breath then we will consider repeat chest imaging to ensure that there are no new blood clots present.

## 2023-02-28 ENCOUNTER — Encounter: Payer: Self-pay | Admitting: Emergency Medicine

## 2023-03-01 ENCOUNTER — Telehealth: Payer: Self-pay | Admitting: Emergency Medicine

## 2023-03-01 NOTE — Telephone Encounter (Signed)
I called and spoke with the pt  She feels worse despite pred given last wk  She is now having chest discomfort and her cough is worse  Cough is non prod  Denies any fever, SOB, wheezing  I have scheduled her appt with Katie for tomorrow at 8:30 am  ED advised sooner if needed

## 2023-03-01 NOTE — Telephone Encounter (Signed)
Patient was on prednisone but she still has a cough from her bronchitis. She coughs so hard that her lungs hurt. She also needs a note for work. [phone]

## 2023-03-01 NOTE — Telephone Encounter (Signed)
See telephone encounter 03/01/23

## 2023-03-02 ENCOUNTER — Ambulatory Visit (INDEPENDENT_AMBULATORY_CARE_PROVIDER_SITE_OTHER): Payer: BLUE CROSS/BLUE SHIELD

## 2023-03-02 ENCOUNTER — Ambulatory Visit: Payer: BLUE CROSS/BLUE SHIELD | Admitting: Nurse Practitioner

## 2023-03-02 ENCOUNTER — Encounter: Payer: Self-pay | Admitting: Nurse Practitioner

## 2023-03-02 VITALS — BP 144/84 | HR 62 | Ht 66.0 in | Wt 278.0 lb

## 2023-03-02 DIAGNOSIS — R0781 Pleurodynia: Secondary | ICD-10-CM | POA: Diagnosis not present

## 2023-03-02 DIAGNOSIS — Z86711 Personal history of pulmonary embolism: Secondary | ICD-10-CM

## 2023-03-02 DIAGNOSIS — R059 Cough, unspecified: Secondary | ICD-10-CM | POA: Diagnosis not present

## 2023-03-02 DIAGNOSIS — J209 Acute bronchitis, unspecified: Secondary | ICD-10-CM

## 2023-03-02 DIAGNOSIS — R0609 Other forms of dyspnea: Secondary | ICD-10-CM

## 2023-03-02 LAB — BASIC METABOLIC PANEL
BUN: 13 mg/dL (ref 6–23)
CO2: 27 meq/L (ref 19–32)
Calcium: 8.8 mg/dL (ref 8.4–10.5)
Chloride: 108 meq/L (ref 96–112)
Creatinine, Ser: 0.91 mg/dL (ref 0.40–1.20)
GFR: 67.08 mL/min (ref >60.00)
Glucose, Bld: 93 mg/dL (ref 70–99)
Potassium: 4 meq/L (ref 3.5–5.1)
Sodium: 143 meq/L (ref 135–145)

## 2023-03-02 LAB — D-DIMER, QUANTITATIVE: D-Dimer, Quant: 0.42 ug{FEU}/mL (ref <0.50)

## 2023-03-02 MED ORDER — PREDNISONE 10 MG PO TABS: ORAL_TABLET | ORAL | 0 refills | Status: DC

## 2023-03-02 MED ORDER — METHYLPREDNISOLONE ACETATE 80 MG/ML IJ SUSP: 0.8000 mg | Freq: Once | INTRAMUSCULAR | Status: DC

## 2023-03-02 MED ORDER — HYDROCODONE BIT-HOMATROP MBR 5-1.5 MG/5ML PO SOLN: 5.0000 mL | Freq: Four times a day (QID) | ORAL | 0 refills | Status: DC | PRN

## 2023-03-02 NOTE — Patient Instructions (Addendum)
Continue Trelegy 1 puff daily. Brush tongue and rinse mouth afterwards Continue Albuterol inhaler 2 puffs or 3 mL neb every 6 hours as needed for shortness of breath or wheezing. Notify if symptoms persist despite rescue inhaler/neb use. Use nebs 2-3 times a day until symptoms improve Continue flonase nasal spray 2 sprays each nostril daily Continue allegra daily Continue Eliquis 2.5 mg Twice daily   Prednisone taper. 4 tabs for 2 days, then 3 tabs for 2 days, 2 tabs for 2 days, then 1 tab for 2 days, then stop. Start tomorrow. Take in AM with food Hycodan cough syrup 5 mL every 6 hours as needed for cough. Do not drive after taking. May cause drowsiness Guaifenesin [phone] mg Twice daily for cough/congestion  Chest x ray today  Labs today   Follow up in 2 weeks with Dr. Delton Coombes or Philis Nettle. If symptoms do not improve or worsen, please contact office for sooner follow up or seek emergency care.

## 2023-03-02 NOTE — Progress Notes (Signed)
@Patient  ID: [name], female    DOB: 11-06-1960, 64 y.o.   MRN: [id number]  Chief Complaint  Patient presents with   Acute Visit    Pt state she was seen by Dr. Delton Coombes 3 days ago , she having a new symptom rt front & back under breast area tenderness only hurts when coughing.    Referring provider: Christen Butter, NP  HPI: 63 year old female, never smoker followed for asthma with chronic bronchitis, lung nodule and pulmonary embolism. She is a patient of Dr. Kavin Leech and last seen in office on 02/23/2023. Past medical history significant for hx of DVT, HTN, GERD, osteoarthritis, anxiety, HLD, IDA, obesity, vitamin D deficiency, sickle cell trait.  TEST/EVENTS:  01/14/2021 echocardiogram: EF 60-65%, mild LVH, GIIDD, RV function and size returned to normal 01/15/2021 PFTs: FVC 2.36 (82), FEV1 2.04 (90), ratio 87, TLC 72%, DLCOcor 73%. Possible mild restrictive lung disease without BD and mild diffusion defect 02/19/2021 CTA chest: no evidence of PE. Stable, benign appearing 2.2 cmx1.3cm cystic structure in right lobe of thyroid. Lungs clear without acute process.   02/23/2023: OV with Dr. Delton Coombes. Question provoked PE/DVT 10/2020 provoked due to immobility due to her arthritis. On prophylactic Eliquis. Intermittent asthma and chronic bronchitis. She had URI 1/3. Treated with doxycycline x 10 days. Feels better but still has some cough and exertional SOB. Treated with prednisone for 5 days. If dyspnea does not improve, may need CTA or VQ scan to rule out PE.  03/02/2023: Today - acute Discussed the use of AI scribe software for clinical note transcription with the patient, who gave verbal consent to proceed.  History of Present Illness   The patient, with a history of pulmonary embolism and asthma, presents with persistent pleuritic chest pain and cough despite recent steroid/abx courses.  She experiences pleuritic chest pain that occurs with coughing. The pain is located to the right of the  sternum and center of the chest. She does have some tenderness to touch. No fever, chills, or hemoptysis.  She has a persistent cough that initially started with sinus symptoms. Her sinus symptoms and cough improved with a course of doxycycline from January 9th to January 19th, but the cough has not resolved. Tessalon Perles were ineffective. She was seen last seen on 1/23 by Dr. Delton Coombes. She was treated with prednisone taper. Her breathing has improved since the initial presentation, but the cough and chest discomfort remain. No current sinus issues. No calf pain or swelling.   She has a history of a PE and is currently on a reduced dose of Eliquis. No missed doses.       Allergies  Allergen Reactions   Sulfa Antibiotics Hives   Lactose Intolerance (Gi)     Immunization History  Administered Date(s) Administered   Influenza Inj Mdck Quad Pf 10/16/2015   Influenza Split 12/28/2012   Influenza,inj,Quad PF,6+ Mos 01/20/2022   Influenza,inj,quad, With Preservative 10/16/2015   Influenza-Unspecified 10/16/2015   PFIZER(Purple Top)SARS-COV-2 Vaccination 03/28/2019, 04/25/2019, 12/24/2019, 09/02/2020   PPD Test 10/14/2011   Pneumococcal Polysaccharide-23 08/28/2014   Respiratory Syncytial Virus Vaccine,Recomb Aduvanted(Arexvy) 01/20/2022   Tdap 11/26/2008    Past Medical History:  Diagnosis Date   Anxiety 05/2018   Arthritis    Asthma    Costochondritis    DVT (deep venous thrombosis) (HCC)    GERD (gastroesophageal reflux disease)    Hyperlipidemia, mixed 08/09/2017   Hypertension    OSA (obstructive sleep apnea) 01/20/2022   Pulmonary emboli (HCC)  Tobacco History: Social History   Tobacco Use  Smoking Status Never  Smokeless Tobacco Never  Tobacco Comments   Exposure to second hand smoke   Counseling given: Not Answered Tobacco comments: Exposure to second hand smoke   Outpatient Medications Prior to Visit  Medication Sig Dispense Refill   acetaminophen  (TYLENOL) 325 MG tablet Take 2 tablets (650 mg total) by mouth every 6 (six) hours as needed for mild pain or headache.     albuterol (PROVENTIL) (2.5 MG/3ML) 0.083% nebulizer solution Use every 4-6 hours as needed for shortness of breath or cough. 360 mL 12   benzonatate (TESSALON) 200 MG capsule TAKE 1 CAPSULE (200 MG TOTAL) BY MOUTH 3 (THREE) TIMES DAILY AS NEEDED FOR COUGH. 30 capsule 1   ELIQUIS 2.5 MG TABS tablet TAKE 1 TABLET BY MOUTH TWICE A DAY 180 tablet 2   fexofenadine (ALLEGRA) 180 MG tablet Take 1 tablet (180 mg total) by mouth daily. 90 tablet 3   fluticasone (FLONASE) 50 MCG/ACT nasal spray SPRAY 2 SPRAYS INTO EACH NOSTRIL EVERY DAY 48 mL 1   Fluticasone-Umeclidin-Vilant (TRELEGY ELLIPTA) 100-62.5-25 MCG/ACT AEPB Inhale 1 puff into the lungs daily. 28 each 11   metoprolol succinate (TOPROL-XL) 50 MG 24 hr tablet TAKE 1 TABLET BY MOUTH EVERY DAY 90 tablet 1   predniSONE (DELTASONE) 20 MG tablet Take 1 tablet (20 mg total) by mouth daily with breakfast. 5 tablet 0   valsartan (DIOVAN) 80 MG tablet Take 1 tablet (80 mg total) by mouth 2 (two) times daily. 180 tablet 2   furosemide (LASIX) 20 MG tablet Take 1 tablet (20 mg total) by mouth daily. 90 tablet 3   No facility-administered medications prior to visit.     Review of Systems:   Constitutional: No weight loss or gain, night sweats, fevers, chills, fatigue, or lassitude. HEENT: No difficulty swallowing, tooth/dental problems, or sore throat. No sneezing, itching, ear ache, headaches, nasal congestion CV:  No chest pain, orthopnea, PND, swelling in lower extremities, anasarca, dizziness, palpitations, syncope Resp: +dry cough; pleuritic pain; chest tightness. No shortness of breath with exertion or at rest.  No excess mucus or change in color of mucus. No hemoptysis. No wheezing.  No chest wall deformity GI:  No heartburn, indigestion, abdominal pain, nausea, vomiting, diarrhea, change in bowel habits, loss of appetite, bloody  stools.  GU: No dysuria, change in color of urine, urgency or frequency.  No flank pain, no hematuria  Skin: No rash, lesions, ulcerations MSK:  No joint pain or swelling.  No decreased range of motion.  No back pain. Neuro: No dizziness or lightheadedness.  Psych: No depression or anxiety. Mood stable.     Physical Exam:  BP (!) 144/84 (BP Location: Right Arm, Patient Position: Sitting, Cuff Size: Large)   Pulse 62   Ht 5\' 6"  (1.676 m)   Wt 278 lb (126.1 kg)   SpO2 100%   BMI 44.87 kg/m   GEN: Pleasant, interactive, well-appearing; morbidly obese; in no acute distress. HEENT:  Normocephalic and atraumatic. EACs patent bilaterally. TM pearly gray with present light reflex bilaterally. PERRLA. Sclera white. Nasal turbinates erythematous, patent bilaterally. No rhinorrhea present. Oropharynx pink and moist, without exudate or edema. No lesions, ulcerations NECK:  Supple w/ fair ROM. No JVD present. Normal carotid impulses w/o bruits. Thyroid symmetrical with no goiter or nodules palpated. No lymphadenopathy.   CV: RRR, no m/r/g, no peripheral edema. Pulses intact, +2 bilaterally. No cyanosis, pallor or clubbing. PULMONARY:  Unlabored, regular  breathing. Minimal scattered rhonchi bilaterally A&P. Bronchitic cough. No accessory muscle use. No dullness to percussion. GI: BS present and normoactive. Soft, non-tender to palpation. No organomegaly or masses detected.  MSK: No erythema, warmth. Right substernal chest wall tenderness; anterior chest wall tenderness upon palpation. Cap refil <2 sec all extrem. No deformities or joint swelling noted.  Neuro: A/Ox3. No focal deficits noted.   Skin: Warm, no lesions or rashe Psych: Normal affect and behavior. Judgement and thought content appropriate.     Lab Results:  CBC    Component Value Date/Time   WBC 11.4 (H) 03/15/2022 1359   RBC 4.87 03/15/2022 1359   HGB 12.8 03/15/2022 1359   HCT 40.0 03/15/2022 1359   PLT 228.0 03/15/2022 1359    MCV 82.2 03/15/2022 1359   MCH 24.7 (L) 02/19/2021 2034   MCHC 31.9 03/15/2022 1359   RDW 15.3 03/15/2022 1359   LYMPHSABS 0.9 03/15/2022 1359   MONOABS 0.4 03/15/2022 1359   EOSABS 0.0 03/15/2022 1359   BASOSABS 0.0 03/15/2022 1359    BMET    Component Value Date/Time   NA 143 03/15/2022 1359   NA 145 (H) 02/25/2021 1133   K 3.9 03/15/2022 1359   CL 108 03/15/2022 1359   CO2 26 03/15/2022 1359   GLUCOSE 126 (H) 03/15/2022 1359   BUN 21 03/15/2022 1359   BUN 11 02/25/2021 1133   CREATININE 1.12 03/15/2022 1359   CALCIUM 9.1 03/15/2022 1359   GFRNONAA >60 02/19/2021 2034    BNP    Component Value Date/Time   BNP 27.6 02/19/2021 2034     Imaging:  No results found.  Administration History     None          Latest Ref Rng & Units 01/15/2021   10:54 AM  PFT Results  FVC-Pre L 2.36   FVC-Predicted Pre % 82   FVC-Post L 2.27   FVC-Predicted Post % 79   Pre FEV1/FVC % % 86   Post FEV1/FCV % % 87   FEV1-Pre L 2.04   FEV1-Predicted Pre % 90   FEV1-Post L 1.97   DLCO uncorrected ml/min/mmHg 15.47   DLCO UNC% % 71   DLCO corrected ml/min/mmHg 15.77   DLCO COR %Predicted % 73   DLVA Predicted % 123   TLC L 3.86   TLC % Predicted % 72   RV % Predicted % 75     No results found for: "NITRICOXIDE"      Assessment & Plan:     Pleuritic Chest Pain Differential includes pleurisy, costochondritis from bronchitis, recurrent PE. Does not seem cardiac in nature. Able to elicit pain with palpation, cough and deep breathing. Given history of pulmonary embolism and persistent cough/pain despite steroids, check STAT D-dimer. If elevated, proceed with CTA chest or VQ scan pending kidney function. Check CXR today. Rechallenge with steroids as DOE improved with previous course. Cough control measures. Hold off on abx if no evidence of superimposed infection on imaging as she has been appropriately covered with empiric doxycycline.  - Order STAT D-dimer test -  Administer depo 80 mg injection x 1 in office  - Prescribe prednisone taper: 40 mg for 2 days, 30 mg for 2 days, 20 mg for 2 days, 10 mg for 2 days - Prescribe hycodan cough syrup - Side effect profile of Hycodan reviewed. Aware to not drive after taking  - Order chest x-ray - Provide work note for 2 days off - Schedule close follow-up appointment in  2 weeks - Strict ED precautions   Pulmonary Embolism History of PE. Currently on a reduced dose of Eliquis at 2.5 mg bid. Given improvement in breathing with previous steroid course, PE is less likely but must be ruled out given persistent pleuritic pain and cough. Discussed potential need for CTA or VQ scan based on kidney function and D-dimer results. - Continue current dose of Eliquis  - Consider CTA or VQ scan if D-dimer is elevated  Bronchitis/Asthma See above. Hold off on additional antibiotics pending lab results and imaging. - Hold off on additional antibiotics pending lab results and imaging  - Cough control - Prednisone taper and depo injection   Follow-up - Follow-up appointment in 2 weeks - Call patient with lab and x-ray results.       I spent 45 minutes of dedicated to the care of this patient on the date of this encounter to include pre-visit review of records, face-to-face time with the patient discussing conditions above, post visit ordering of testing, clinical documentation with the electronic health record, making appropriate referrals as documented, and communicating necessary findings to members of the patients care team.  Noemi Chapel, NP 03/02/2023  Pt aware and understands NP's role.

## 2023-03-06 DIAGNOSIS — J329 Chronic sinusitis, unspecified: Secondary | ICD-10-CM

## 2023-03-07 MED ORDER — CEFDINIR 300 MG PO CAPS: 300.0000 mg | ORAL_CAPSULE | Freq: Two times a day (BID) | ORAL | 0 refills | Status: AC

## 2023-03-16 ENCOUNTER — Ambulatory Visit: Payer: BLUE CROSS/BLUE SHIELD | Admitting: Nurse Practitioner

## 2023-03-18 ENCOUNTER — Other Ambulatory Visit: Payer: Self-pay | Admitting: Adult Health

## 2023-03-18 DIAGNOSIS — R058 Other specified cough: Secondary | ICD-10-CM

## 2023-03-18 DIAGNOSIS — J302 Other seasonal allergic rhinitis: Secondary | ICD-10-CM

## 2023-03-23 ENCOUNTER — Telehealth: Payer: BLUE CROSS/BLUE SHIELD | Admitting: Emergency Medicine

## 2023-04-05 NOTE — Telephone Encounter (Signed)
 Start nebulizer treatments 2-4 times a day. Use cough syrup as needed for cough. Call if symptoms persist or do not improve. Thanks.

## 2023-04-11 MED ORDER — ALBUTEROL SULFATE HFA 108 (90 BASE) MCG/ACT IN AERS: 2.0000 | INHALATION_SPRAY | Freq: Four times a day (QID) | RESPIRATORY_TRACT | 5 refills | Status: DC | PRN

## 2023-04-13 ENCOUNTER — Other Ambulatory Visit (HOSPITAL_BASED_OUTPATIENT_CLINIC_OR_DEPARTMENT_OTHER): Payer: Self-pay | Admitting: Medical-Surgical

## 2023-04-13 DIAGNOSIS — Z1231 Encounter for screening mammogram for malignant neoplasm of breast: Secondary | ICD-10-CM

## 2023-04-19 ENCOUNTER — Inpatient Hospital Stay (HOSPITAL_BASED_OUTPATIENT_CLINIC_OR_DEPARTMENT_OTHER): Admission: RE | Admit: 2023-04-19 | Source: Ambulatory Visit | Admitting: Radiology

## 2023-04-22 ENCOUNTER — Other Ambulatory Visit: Payer: Self-pay | Admitting: Cardiology

## 2023-07-18 ENCOUNTER — Other Ambulatory Visit: Payer: Self-pay | Admitting: Cardiology

## 2023-08-18 ENCOUNTER — Other Ambulatory Visit: Payer: Self-pay | Admitting: Cardiology

## 2023-08-18 NOTE — Telephone Encounter (Signed)
 Pt needs appt with Cardiologist for future refills and Rx was sent in for a 30 day supply 08/18/23

## 2023-08-29 ENCOUNTER — Other Ambulatory Visit: Payer: Self-pay | Admitting: Cardiology

## 2023-08-29 ENCOUNTER — Ambulatory Visit: Admitting: Cardiology

## 2023-08-29 ENCOUNTER — Telehealth: Payer: Self-pay | Admitting: Cardiology

## 2023-08-29 NOTE — Telephone Encounter (Signed)
*  STAT* If patient is at the pharmacy, call can be transferred to refill team.   1. Which medications need to be refilled? (please list name of each medication and dose if known) metoprolol  succinate (TOPROL -XL) 50 MG 24 hr tablet   2. Which pharmacy/location (including street and city if local pharmacy) is medication to be sent to?  [id number] - [place], [place] - [address]      3. Do they need a 30 day or 90 day supply? 90 day   Pt has office visit scheduled in Sept .

## 2023-08-30 ENCOUNTER — Encounter: Payer: Self-pay | Admitting: Emergency Medicine

## 2023-08-30 MED ORDER — METOPROLOL SUCCINATE ER 50 MG PO TB24: 50.0000 mg | ORAL_TABLET | Freq: Every day | ORAL | 1 refills | Status: DC

## 2023-08-30 NOTE — Telephone Encounter (Signed)
 Rx sent to pharmacy

## 2023-09-04 ENCOUNTER — Ambulatory Visit (INDEPENDENT_AMBULATORY_CARE_PROVIDER_SITE_OTHER): Admitting: Medical-Surgical

## 2023-09-04 ENCOUNTER — Ambulatory Visit (INDEPENDENT_AMBULATORY_CARE_PROVIDER_SITE_OTHER)

## 2023-09-04 VITALS — BP 135/75 | HR 84 | Resp 20 | Ht 66.0 in | Wt 270.0 lb

## 2023-09-04 DIAGNOSIS — M79641 Pain in right hand: Secondary | ICD-10-CM | POA: Diagnosis not present

## 2023-09-04 DIAGNOSIS — M79642 Pain in left hand: Secondary | ICD-10-CM

## 2023-09-04 DIAGNOSIS — G8929 Other chronic pain: Secondary | ICD-10-CM | POA: Diagnosis not present

## 2023-09-04 DIAGNOSIS — Z1211 Encounter for screening for malignant neoplasm of colon: Secondary | ICD-10-CM

## 2023-09-04 NOTE — Patient Instructions (Addendum)
 Taper for switching to Cymbalta  from Zoloft:  Weeks 1 and 2:   Zoloft 100mg  daily PLUS Cymbalta  30mg  daily  Weeks 3 and 4:   Zoloft 50mg  daily PLUS Cymbalta  60mg  daily  Weeks 5 and 6:   Stop Zoloft. Continue Cymbalta  60mg  daily.

## 2023-09-04 NOTE — Progress Notes (Signed)
        Established patient visit   History of Present Illness   Discussed the use of AI scribe software for clinical note transcription with the patient, who gave verbal consent to proceed.  History of Present Illness   Alyssa [name] is a 63 year old female who presents with bilateral hand pain and trigger finger.  She experiences bilateral hand pain, described as a dull ache, worsened by gripping and twisting motions. There is a decrease in grip strength, making tasks like opening jars difficult, and she often drops objects unexpectedly. Her right ring finger gets stuck in a bent position, requiring manual manipulation to straighten, accompanied by a sensation similar to 'dull knuckle cracking.'  Recently, she has developed numbness and tingling in all fingers of her right hand, excluding the thumb, over the past week and a half. Her medical history includes osteoarthritis affecting multiple areas. She has a family history of rheumatoid arthritis. She is currently taking Eliquis  and Zoloft, with a dosage of 150 mg of Zoloft at bedtime.       Physical Exam   Physical Exam Vitals reviewed.  Constitutional:      General: She is not in acute distress.    Appearance: Normal appearance. She is obese. She is not ill-appearing.  HENT:     Head: Normocephalic and atraumatic.  Cardiovascular:     Rate and Rhythm: Normal rate and regular rhythm.     Pulses: Normal pulses.     Heart sounds: Normal heart sounds. No murmur heard.    No friction rub. No gallop.  Pulmonary:     Effort: Pulmonary effort is normal. No respiratory distress.     Breath sounds: Normal breath sounds. No wheezing.  Skin:    General: Skin is warm and dry.  Neurological:     Mental Status: She is alert and oriented to person, place, and time.  Psychiatric:        Mood and Affect: Mood normal.        Behavior: Behavior normal.        Thought Content: Thought content normal.        Judgment: Judgment normal.      Assessment & Plan   Assessment and Plan    Bilateral hand pain and stiffness with right ring finger trigger finger and right-hand paresthesia Chronic bilateral hand pain and stiffness with decreased grip strength. Right ring finger trigger finger due to tendon sheath inflammation. Right hand paresthesia in all fingers except thumb. Differential includes osteoarthritis and rheumatoid arthritis. - Order x-rays of hands. - Discuss gabapentin, pregabalin, or Cymbalta  for pain management. - Initiate Cymbalta  30 mg, taper sertraline per provided instructions. Notify psychiatrist of medication changes. - Provide medication information for review. - Refer to Doctor T for trigger finger injections if consented.  Depression Currently on sertraline 150 mg. Transition to Cymbalta  considered for mood and pain benefits. - Initiate Cymbalta  30 mg, taper sertraline under psychiatrist guidance. - Coordinate with psychiatrist Dr. Micky for medication management.  General Health Maintenance Due for routine screenings. Prefers Cologuard for colon cancer screening. - Schedule mammogram. - Use Cologuard for colon cancer screening.      Follow up   Return in about 4 weeks (around 10/02/2023) for mood/arthritis check.  __________________________________ Zada FREDRIK Palin, DNP, APRN, FNP-BC Primary Care and Sports Medicine [facility]

## 2023-09-08 ENCOUNTER — Encounter: Payer: Self-pay | Admitting: Medical-Surgical

## 2023-09-08 MED ORDER — DULOXETINE HCL 30 MG PO CPEP: 30.0000 mg | ORAL_CAPSULE | Freq: Every day | ORAL | 3 refills | Status: DC

## 2023-09-11 MED ORDER — PREDNISONE 50 MG PO TABS: 50.0000 mg | ORAL_TABLET | Freq: Every day | ORAL | 0 refills | Status: AC

## 2023-09-11 NOTE — Addendum Note (Signed)
 Addended byBETHA WILLO MINI on: 09/11/2023 05:32 PM   Modules accepted: Orders

## 2023-09-13 ENCOUNTER — Encounter: Payer: Self-pay | Admitting: Medical-Surgical

## 2023-09-13 NOTE — Telephone Encounter (Signed)
 Spoke with radiology reading room- x-rays moved up for reading.

## 2023-09-13 NOTE — Telephone Encounter (Signed)
 Would you like me to request these x-rays be changed to STAT read ? If so, I am now required to give them a reason for this.

## 2023-09-14 ENCOUNTER — Ambulatory Visit: Payer: Self-pay | Admitting: Medical-Surgical

## 2023-10-03 ENCOUNTER — Encounter: Payer: Self-pay | Admitting: Sports Medicine

## 2023-10-03 ENCOUNTER — Encounter: Payer: Self-pay | Admitting: Cardiology

## 2023-10-03 ENCOUNTER — Other Ambulatory Visit: Payer: Self-pay

## 2023-10-03 MED ORDER — VALSARTAN 80 MG PO TABS: 80.0000 mg | ORAL_TABLET | Freq: Two times a day (BID) | ORAL | 3 refills | Status: AC

## 2023-10-09 ENCOUNTER — Ambulatory Visit: Admitting: Medical-Surgical

## 2023-10-09 DIAGNOSIS — K219 Gastro-esophageal reflux disease without esophagitis: Secondary | ICD-10-CM | POA: Insufficient documentation

## 2023-10-09 DIAGNOSIS — I2699 Other pulmonary embolism without acute cor pulmonale: Secondary | ICD-10-CM | POA: Insufficient documentation

## 2023-10-09 DIAGNOSIS — I10 Essential (primary) hypertension: Secondary | ICD-10-CM | POA: Insufficient documentation

## 2023-10-09 MED ORDER — METOPROLOL SUCCINATE ER 50 MG PO TB24: 50.0000 mg | ORAL_TABLET | Freq: Every day | ORAL | 0 refills | Status: DC

## 2023-10-10 ENCOUNTER — Ambulatory Visit: Admitting: Cardiology

## 2023-10-16 ENCOUNTER — Ambulatory Visit: Attending: Cardiology | Admitting: Cardiology

## 2023-11-04 ENCOUNTER — Other Ambulatory Visit: Payer: Self-pay | Admitting: Cardiology

## 2023-11-22 ENCOUNTER — Encounter: Payer: Self-pay | Admitting: Medical-Surgical

## 2023-11-24 MED ORDER — SERTRALINE HCL 50 MG PO TABS: ORAL_TABLET | ORAL | 0 refills | Status: AC

## 2023-12-10 ENCOUNTER — Other Ambulatory Visit: Payer: Self-pay | Admitting: Cardiology

## 2024-01-08 ENCOUNTER — Encounter: Payer: Self-pay | Admitting: Emergency Medicine

## 2024-01-08 ENCOUNTER — Other Ambulatory Visit: Payer: Self-pay

## 2024-01-08 MED ORDER — APIXABAN 2.5 MG PO TABS: 2.5000 mg | ORAL_TABLET | Freq: Two times a day (BID) | ORAL | 0 refills | Status: AC

## 2024-01-23 ENCOUNTER — Encounter: Payer: Self-pay | Admitting: Emergency Medicine

## 2024-01-30 NOTE — Telephone Encounter (Signed)
 I have been out of the office since 12/23 until today, 12/30. Please call pt to ensure she received the copay assistance cards and has resumed her Eliquis  today. She also should have completed pt assistance through the manufacturer. Thanks.

## 2024-01-30 NOTE — Telephone Encounter (Signed)
 ATC x1. LMTCB or respond via mychart

## 2024-02-06 NOTE — Telephone Encounter (Signed)
 Alyssa [name], She was not able to get the Eliquis .  I told her the cards were ment to use while she still had commercial insurance.  We only have 5 mg tables and we cannot cut them in half.  I asked her if she has contacted her PCP office to see if they have any 2.5 mg tablets.  She called patient assistance and all they could tell her is that they would process her application within the quarter.  I am not sure what else to suggest.  Please advise.  Thank you.

## 2024-02-07 NOTE — Telephone Encounter (Signed)
 Dr. Shelah, Pt of your's. Having difficulties with affording Eliquis  now. She has filed for patient assistance. She had PE/DVT 10/2020 felt to be provoked due to immobility due to her arthritis. She is still relatively sedentary. I have not seen her in a year but she has been on low dose Eliquis  protocol. Do you want her to increase to 5 mg Twice daily so we can provide her with samples, or do you think she is appropriate to d/c at this point? Will need f/u with you as she is overdue. Thanks!
